# Patient Record
Sex: Male | Born: 1960 | Race: White | Hispanic: No | Marital: Married | Smoking: Never smoker
Health system: Southern US, Community
[De-identification: ages and names within clinical notes are randomized; demographics above are authoritative.]

## PROBLEM LIST (undated history)

## (undated) DIAGNOSIS — B351 Tinea unguium: Secondary | ICD-10-CM

## (undated) DIAGNOSIS — W57XXXA Bitten or stung by nonvenomous insect and other nonvenomous arthropods, initial encounter: Secondary | ICD-10-CM

## (undated) DIAGNOSIS — F419 Anxiety disorder, unspecified: Secondary | ICD-10-CM

## (undated) DIAGNOSIS — H18519 Endothelial corneal dystrophy, unspecified eye: Secondary | ICD-10-CM

## (undated) DIAGNOSIS — H1851 Endothelial corneal dystrophy: Secondary | ICD-10-CM

## (undated) DIAGNOSIS — I10 Essential (primary) hypertension: Secondary | ICD-10-CM

## (undated) HISTORY — PX: TONSILLECTOMY: SUR1361

## (undated) HISTORY — DX: Anxiety disorder, unspecified: F41.9

## (undated) HISTORY — DX: Endothelial corneal dystrophy, unspecified eye: H18.519

## (undated) HISTORY — DX: Tinea unguium: B35.1

## (undated) HISTORY — DX: Endothelial corneal dystrophy: H18.51

## (undated) HISTORY — PX: LITHOTRIPSY: SUR834

## (undated) HISTORY — DX: Bitten or stung by nonvenomous insect and other nonvenomous arthropods, initial encounter: W57.XXXA

## (undated) HISTORY — PX: NASAL SEPTUM SURGERY: SHX37

## (undated) HISTORY — DX: Essential (primary) hypertension: I10

---

## 2005-10-25 ENCOUNTER — Ambulatory Visit: Payer: Self-pay | Admitting: Family Medicine

## 2005-11-23 ENCOUNTER — Ambulatory Visit: Payer: Self-pay | Admitting: Internal Medicine

## 2007-08-29 ENCOUNTER — Telehealth: Payer: Self-pay | Admitting: Internal Medicine

## 2007-09-03 ENCOUNTER — Ambulatory Visit: Payer: Self-pay | Admitting: Internal Medicine

## 2007-09-03 DIAGNOSIS — F411 Generalized anxiety disorder: Secondary | ICD-10-CM

## 2007-09-03 DIAGNOSIS — L258 Unspecified contact dermatitis due to other agents: Secondary | ICD-10-CM

## 2007-09-25 ENCOUNTER — Encounter: Payer: Self-pay | Admitting: Internal Medicine

## 2007-10-31 ENCOUNTER — Ambulatory Visit: Payer: Self-pay | Admitting: Internal Medicine

## 2008-01-28 ENCOUNTER — Ambulatory Visit: Payer: Self-pay | Admitting: Internal Medicine

## 2008-01-28 DIAGNOSIS — A6000 Herpesviral infection of urogenital system, unspecified: Secondary | ICD-10-CM | POA: Insufficient documentation

## 2008-01-28 DIAGNOSIS — B3749 Other urogenital candidiasis: Secondary | ICD-10-CM

## 2008-04-09 ENCOUNTER — Telehealth: Payer: Self-pay | Admitting: Internal Medicine

## 2008-05-08 ENCOUNTER — Ambulatory Visit: Payer: Self-pay | Admitting: Internal Medicine

## 2008-05-08 DIAGNOSIS — K219 Gastro-esophageal reflux disease without esophagitis: Secondary | ICD-10-CM

## 2008-05-08 DIAGNOSIS — R42 Dizziness and giddiness: Secondary | ICD-10-CM | POA: Insufficient documentation

## 2008-08-10 ENCOUNTER — Ambulatory Visit: Payer: Self-pay | Admitting: Internal Medicine

## 2008-10-12 ENCOUNTER — Ambulatory Visit: Payer: Self-pay | Admitting: Internal Medicine

## 2008-12-16 ENCOUNTER — Ambulatory Visit: Payer: Self-pay | Admitting: Internal Medicine

## 2009-03-28 ENCOUNTER — Telehealth: Payer: Self-pay | Admitting: *Deleted

## 2009-04-04 ENCOUNTER — Emergency Department (HOSPITAL_BASED_OUTPATIENT_CLINIC_OR_DEPARTMENT_OTHER): Admission: EM | Admit: 2009-04-04 | Discharge: 2009-04-04 | Payer: Self-pay | Admitting: Emergency Medicine

## 2009-04-05 ENCOUNTER — Ambulatory Visit: Payer: Self-pay | Admitting: Internal Medicine

## 2009-04-05 DIAGNOSIS — IMO0001 Reserved for inherently not codable concepts without codable children: Secondary | ICD-10-CM | POA: Insufficient documentation

## 2009-04-15 ENCOUNTER — Telehealth: Payer: Self-pay | Admitting: Internal Medicine

## 2009-08-17 ENCOUNTER — Telehealth: Payer: Self-pay | Admitting: *Deleted

## 2009-09-15 ENCOUNTER — Ambulatory Visit: Payer: Self-pay | Admitting: Internal Medicine

## 2009-12-22 ENCOUNTER — Ambulatory Visit: Payer: Self-pay | Admitting: Internal Medicine

## 2009-12-22 LAB — CONVERTED CEMR LAB
ALT: 27 units/L (ref 0–53)
AST: 22 units/L (ref 0–37)
Albumin: 4.1 g/dL (ref 3.5–5.2)
Calcium: 9.4 mg/dL (ref 8.4–10.5)
Eosinophils Relative: 1.8 % (ref 0.0–5.0)
GFR calc non Af Amer: 85.1 mL/min (ref >60)
HCT: 45.8 % (ref 39.0–52.0)
Hemoglobin: 14.9 g/dL (ref 13.0–17.0)
Lymphs Abs: 1.6 10*3/uL (ref 0.7–4.0)
Monocytes Relative: 9 % (ref 3.0–12.0)
Neutro Abs: 3.9 10*3/uL (ref 1.4–7.7)
RDW: 12.4 % (ref 11.5–14.6)
Sodium: 142 meq/L (ref 135–145)
Total Protein: 6.7 g/dL (ref 6.0–8.3)
WBC: 6.2 10*3/uL (ref 4.5–10.5)

## 2010-02-07 ENCOUNTER — Ambulatory Visit: Payer: Self-pay | Admitting: Internal Medicine

## 2010-02-07 DIAGNOSIS — N200 Calculus of kidney: Secondary | ICD-10-CM | POA: Insufficient documentation

## 2010-02-07 DIAGNOSIS — F988 Other specified behavioral and emotional disorders with onset usually occurring in childhood and adolescence: Secondary | ICD-10-CM | POA: Insufficient documentation

## 2010-04-26 ENCOUNTER — Ambulatory Visit: Payer: Self-pay | Admitting: Internal Medicine

## 2010-05-27 ENCOUNTER — Ambulatory Visit: Payer: Self-pay | Admitting: Internal Medicine

## 2010-06-27 ENCOUNTER — Telehealth: Payer: Self-pay | Admitting: Internal Medicine

## 2010-07-27 ENCOUNTER — Telehealth: Payer: Self-pay | Admitting: Internal Medicine

## 2010-08-01 ENCOUNTER — Telehealth: Payer: Self-pay | Admitting: Internal Medicine

## 2010-08-10 ENCOUNTER — Ambulatory Visit: Payer: Self-pay | Admitting: Family Medicine

## 2010-08-10 DIAGNOSIS — L732 Hidradenitis suppurativa: Secondary | ICD-10-CM

## 2010-08-11 ENCOUNTER — Encounter: Payer: Self-pay | Admitting: Internal Medicine

## 2010-08-17 ENCOUNTER — Telehealth (INDEPENDENT_AMBULATORY_CARE_PROVIDER_SITE_OTHER): Payer: Self-pay | Admitting: *Deleted

## 2010-08-29 ENCOUNTER — Telehealth: Payer: Self-pay | Admitting: Internal Medicine

## 2010-09-12 ENCOUNTER — Ambulatory Visit: Payer: Self-pay | Admitting: Internal Medicine

## 2010-09-12 DIAGNOSIS — N401 Enlarged prostate with lower urinary tract symptoms: Secondary | ICD-10-CM

## 2010-09-27 ENCOUNTER — Telehealth: Payer: Self-pay | Admitting: Internal Medicine

## 2010-09-30 ENCOUNTER — Telehealth: Payer: Self-pay | Admitting: Internal Medicine

## 2010-11-03 ENCOUNTER — Telehealth: Payer: Self-pay | Admitting: Internal Medicine

## 2010-11-03 ENCOUNTER — Ambulatory Visit
Admission: RE | Admit: 2010-11-03 | Discharge: 2010-11-03 | Payer: Self-pay | Source: Home / Self Care | Attending: Internal Medicine | Admitting: Internal Medicine

## 2010-11-10 NOTE — Progress Notes (Signed)
Summary: NEW RX  Phone Note Refill Request Call back at Work Phone [phone] Call back at [phone]   Refills Requested: Medication #1:  ADDERALL 15 MG TABS One two times a day early am & 6 hours later NEW  RX  Initial call taken by: Heron Sabins,  August 29, 2010 1:35 PM    Prescriptions: ADDERALL 15 MG TABS (AMPHETAMINE-DEXTROAMPHETAMINE) One two times a day early am & 6 hours later  #60 x 0   Entered by:   Willy Eddy, LPN   Authorized by:   Stacie Glaze MD   Signed by:   Willy Eddy, LPN on 95/62/1308   Method used:   Print then Give to Patient   RxID:   [id number]

## 2010-11-10 NOTE — Progress Notes (Signed)
Summary: lab results  Phone Note Call from Patient Call back at Work Phone [phone]   Call For: Dr. Abner Greenspan Reason for Call: Lab or Test Results Action Taken: Provider Notified Summary of Call: [phone] Please call pt on his culture results. Initial call taken by: Lynann Beaver CMA AAMA,  August 17, 2010 9:21 AM  Follow-up for Phone Call        no growth, please notify Follow-up by: Danise Edge MD,  August 17, 2010 11:15 AM  Additional Follow-up for Phone Call Additional follow up Details #1::        Left message for pt to return my call. Additional Follow-up by: Josph Macho RMA,  August 17, 2010 12:03 PM    Additional Follow-up for Phone Call Additional follow up Details #2::    patient informed Follow-up by: Josph Macho RMA,  August 17, 2010 2:17 PM

## 2010-11-10 NOTE — Progress Notes (Signed)
Summary: new rx  Phone Note Refill Request Call back at Work Phone [phone] Call back at [phone] Message from:  Patient  pt would like ritalin or concerta adderall is on back order  Initial call taken by: Heron Sabins,  September 27, 2010 3:31 PM  Follow-up for Phone Call        may change to concerta added to list may print for pick up on month trial Follow-up by: Stacie Glaze MD,  September 29, 2010 8:57 AM    New/Updated Medications: CONCERTA 36 MG CR-TABS (METHYLPHENIDATE HCL) one by mouth daily Prescriptions: CONCERTA 36 MG CR-TABS (METHYLPHENIDATE HCL) one by mouth daily  #30 x 0   Entered by:   Willy Eddy, LPN   Authorized by:   Stacie Glaze MD   Signed by:   Willy Eddy, LPN on 47/82/9562   Method used:   Print then Give to Patient   RxID:   [id number]

## 2010-11-10 NOTE — Progress Notes (Signed)
Summary: [facility] called. Polysporin listed as OTC med not for eye  Phone Note From Pharmacy Call back at [phone] ext [phone]   [place]   Caller: [facility] [address]  - [place] Summary of Call: Pharmacy called and said that the Polysporin prescribed in an OTC med that can not be put in the eye. Phamacist says that there is a Bacitracin that can be put in eye.     Initial call taken by: Lucy Antigua,  November 03, 2010 10:26 AM  Follow-up for Phone Call        incorrect. there is a polysporin opthalmic version. if they don't have it then they can tell you what abx opthalmic ointments they do carry and I will change it. Follow-up by: Edwyna Perfect MD,  November 03, 2010 10:28 AM  Additional Follow-up for Phone Call Additional follow up Details #1::        pharm aware. Chris at [facility] notes that he found the correct rx and will take care of it Additional Follow-up by: Kyung Rudd, CMA,  November 03, 2010 10:36 AM

## 2010-11-10 NOTE — Progress Notes (Signed)
Summary: Pt req script for Amphetamin-Dextroamphetamine 10mg   Phone Note Refill Request Call back at Work Phone [phone] Call back at [phone] cell   (call either #) Message from:  Patient on July 27, 2010 9:35 AM  Refills Requested: Medication #1:  AMPHETAMINE-DEXTROAMPHETAMINE 10 MG TABS one by mouth  first thing in the am and 6 hours later   Dosage confirmed as above?Dosage Confirmed Pt only has 4 pills lft. Pls write script and call when ready for pick up.    Method Requested: Pick up at Office Initial call taken by: Lucy Antigua,  July 27, 2010 9:35 AM    Prescriptions: AMPHETAMINE-DEXTROAMPHETAMINE 10 MG TABS (AMPHETAMINE-DEXTROAMPHETAMINE) one by mouth  first thing in the am and 6 hours later  #60 x 0   Entered by:   Willy Eddy, LPN   Authorized by:   Stacie Glaze MD   Signed by:   Willy Eddy, LPN on 74/25/9563   Method used:   Print then Give to Patient   RxID:   [id number] AMPHETAMINE-DEXTROAMPHETAMINE 10 MG TABS (AMPHETAMINE-DEXTROAMPHETAMINE) one by mouth  first thing in the am and 6 hours later  #60 x 0   Entered by:   Willy Eddy, LPN   Authorized by:   Stacie Glaze MD   Signed by:   Willy Eddy, LPN on 60/63/0160   Method used:   Print then Give to Patient   RxID:   [id number]

## 2010-11-10 NOTE — Assessment & Plan Note (Signed)
Summary: 1 month follow up/cjr   Vital Signs:  Patient profile:   50 year old male Height:      73 inches Weight:      250 pounds BMI:     33.10 Temp:     98.2 degrees F oral Pulse rate:   72 / minute Resp:     14 per minute BP sitting:   140 / 80  (left arm)  Vitals Entered By: Willy Eddy, LPN (May 27, 2010 1:58 PM) CC: roa- states new samples meds helped. stopped for a while but started back and was helping with anxiety- name unknown, Hypertension Management   CC:  roa- states new samples meds helped. stopped for a while but started back and was helping with anxiety- name unknown and Hypertension Management.  History of Present Illness: follow up new medications no apparent side effects and med seems to be workinf we have been supplying samples due to costs of new drug  Hypertension History:      He denies headache, chest pain, palpitations, dyspnea with exertion, orthopnea, PND, peripheral edema, visual symptoms, neurologic problems, syncope, and side effects from treatment.        Positive major cardiovascular risk factors include male age 45 years old or older and hypertension.  Negative major cardiovascular risk factors include negative family history for ischemic heart disease and non-tobacco-user status.     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) 11)  Acute Prostatitis  (ICD-601.0) 12)   Anxiety  (ICD-300.00) 13)  Hypertension  (ICD-401.9)  Current Problems (verified): 1)  Depression, Major, Recurrent, 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 Once Daily 2)  Sudafed 30 Mg  Tabs (Pseudoephedrine Hcl) .... Once Daily As Needed 3)  Lotrimin Ultra 1 %  Crea (Butenafine Hcl) .... Apply To Areas Bid 4)  Lexapro 10 Mg Tabs (Escitalopram Oxalate) .... One By Mouth Daily 5)  Diazepam 2 Mg Tabs (Diazepam) .... One By Mouth Bid 6)  Amphetamine-Dextroamphetamine 10 Mg Tabs (Amphetamine-Dextroamphetamine) .... One By Mouth  First Thing in The Am and 6 Hours Later 7)  Doxycycline Hyclate 100 Mg Caps (Doxycycline Hyclate) .... One By Mouth Two Times A Day X 14 Days  Current Medications (verified): 1)  Ziac 5-6.25 Mg  Tabs (Bisoprolol-Hydrochlorothiazide) .... Once Daily 2)  Sudafed 30 Mg  Tabs (Pseudoephedrine Hcl) .... Once Daily As Needed 3)  Lotrimin Ultra 1 %  Crea (Butenafine Hcl) .... Apply To Areas Bid 4)  Viibryd 40mg  .... One By Mouth Daily 5)  Diazepam 2 Mg Tabs (Diazepam) .... One By Mouth Bid 6)  Amphetamine-Dextroamphetamine 10 Mg Tabs (Amphetamine-Dextroamphetamine) .... One By Mouth  First Thing in The Am and 6 Hours Later 7)  Doxycycline Hyclate 100 Mg Caps (Doxycycline Hyclate) .... One By Mouth Two Times A Day  Allergies (verified): No Known Drug Allergies  Past History:  Social History: Last  updated: 09/03/2007 Married Never Smoked Drug use-no Regular exercise-no  Risk Factors: Exercise: no (09/03/2007)  Risk Factors: Smoking Status: never (05/27/2010) Passive Smoke Exposure: no (05/27/2010)  Past medical, surgical, family and social histories (including risk factors) reviewed, and no changes noted (except as noted below).  Past Medical History: Reviewed history from 04/05/2009 and no changes required. Hypertension Anxiety fungal nail insect bite  Family History: Reviewed history and no changes required.  Social History: Reviewed history from 09/03/2007 and no changes required. Married Never Smoked Drug use-no Regular exercise-no  Review of Systems  The patient denies anorexia, fever, weight loss, weight gain, vision loss, decreased hearing, hoarseness, chest pain, syncope, dyspnea on exertion, peripheral edema, prolonged cough, headaches, hemoptysis, abdominal pain, melena, hematochezia, severe indigestion/heartburn, hematuria, incontinence, genital sores, muscle weakness, suspicious skin lesions, transient blindness, difficulty walking, depression, unusual weight change, abnormal bleeding, enlarged lymph nodes, angioedema, and breast masses.    Physical Exam  General:  Well-developed,well-nourished,in no acute distress; alert,appropriate and cooperative throughout examination Eyes:  pupils equal and pupils round.  conjunctival injection and excessive tearing.   Ears:  R ear normal and L ear normal.   Neck:  No deformities, masses, or tenderness noted. Lungs:  normal respiratory effort and no wheezes.   Abdomen:  soft and non-tender.   Msk:  normal ROM and no joint tenderness.   Neurologic:  gait normal.     Impression & Recommendations:  Problem # 1:  DEPRESSION, MAJOR, RECURRENT, SEVERE (ICD-296.33) viibryd trial with  samples for medication resistant feels he has an increased sense of well being  Problem # 2:  ATTENTION DEFICIT DISORDER, ADULT  (ICD-314.00) on generic ritalin  Problem # 3:  ACUTE PROSTATITIS (ICD-601.0) increased urination and frequency  Problem # 4:  HYPERTENSION (ICD-401.9)  His updated medication list for this problem includes:    Ziac 5-6.25 Mg Tabs (Bisoprolol-hydrochlorothiazide) ..... Once daily  BP today: 140/80 Prior BP: 140/80 (04/26/2010)  Prior 10 Yr Risk Heart Disease: Not enough information (09/03/2007)  Labs Reviewed: K+: 3.6 (12/22/2009) Creat: : 1.0 (12/22/2009)     Complete Medication List: 1)  Ziac 5-6.25 Mg Tabs (Bisoprolol-hydrochlorothiazide) .... Once daily 2)  Sudafed 30 Mg Tabs (Pseudoephedrine hcl) .... Once daily as needed 3)  Lotrimin Ultra 1 % Crea (Butenafine hcl) .... Apply to areas bid 4)  Viibryd 40mg   .... One by mouth daily 5)  Diazepam 2 Mg Tabs (Diazepam) .... One by mouth bid 6)  Amphetamine-dextroamphetamine 10 Mg Tabs (Amphetamine-dextroamphetamine) .... One by mouth  first thing in the am and 6 hours later 7)  Doxycycline Hyclate 100 Mg Caps (Doxycycline hyclate) .... One by mouth two times a day  Hypertension Assessment/Plan:      The patient's hypertensive risk group is category B: At least one risk factor (excluding diabetes) with no target organ damage.  Today's blood pressure is 140/80.  His blood pressure goal is < 140/90.  Patient Instructions: 1)  Please schedule a follow-up appointment in 3 months. Prescriptions: AMPHETAMINE-DEXTROAMPHETAMINE 10 MG TABS (AMPHETAMINE-DEXTROAMPHETAMINE)  one by mouth  first thing in the am and 6 hours later  #60 x 0   Entered and Authorized by:   Stacie Glaze MD   Signed by:   Stacie Glaze MD on 05/27/2010   Method used:   Print then Give to Patient   RxID:   [id number] AMPHETAMINE-DEXTROAMPHETAMINE 10 MG TABS (AMPHETAMINE-DEXTROAMPHETAMINE) one by mouth  first thing in the am and 6 hours later  #60 x 0   Entered and Authorized by:   Stacie Glaze MD   Signed by:   Stacie Glaze MD on 05/27/2010   Method  used:   Print then Give to Patient   RxID:   [id number] AMPHETAMINE-DEXTROAMPHETAMINE 10 MG TABS (AMPHETAMINE-DEXTROAMPHETAMINE) one by mouth  first thing in the am and 6 hours later  #60 x 0   Entered and Authorized by:   Stacie Glaze MD   Signed by:   Stacie Glaze MD on 05/27/2010   Method used:   Print then Give to Patient   RxID:   [id number] DOXYCYCLINE HYCLATE 100 MG CAPS (DOXYCYCLINE HYCLATE) one by mouth two times a day  #30 x 0   Entered and Authorized by:   Stacie Glaze MD   Signed by:   Stacie Glaze MD on 05/27/2010   Method used:   Electronically to        [facility] [address] (retail)       [address]       [place], [place]  [address]       Ph: [phone]       Fax: [phone]   RxID:   [id number]

## 2010-11-10 NOTE — Progress Notes (Signed)
  Phone Note Call from Patient   Summary of Call: had adderall changed to concerta due to adderall not being available- now wants to change concerta to ritalin due to cost of concerta Initial call taken by: Willy Eddy, LPN,  September 30, 2010 1:27 PM  Follow-up for Phone Call        dr Kirtland Bouchard wrote for 3 months Follow-up by: Willy Eddy, LPN,  September 30, 2010 2:46 PM    New/Updated Medications: METHYLPHENIDATE HCL CR 20 MG CR-TABS (METHYLPHENIDATE HCL) one every am Prescriptions: METHYLPHENIDATE HCL CR 20 MG CR-TABS (METHYLPHENIDATE HCL) one every am  #30 x 0   Entered and Authorized by:   Gordy Savers  MD   Signed by:   Gordy Savers  MD on 09/30/2010   Method used:   Print then Give to Patient   RxID:   [id number]

## 2010-11-10 NOTE — Assessment & Plan Note (Signed)
Summary: 1 mo rov/mm/pt rsc/cjr   Vital Signs:  Patient profile:   50 year old male Height:      73 inches Weight:      246 pounds BMI:     32.57 Temp:     98.2 degrees F oral Pulse rate:   72 / minute Resp:     14 per minute BP sitting:   140 / 80  (left arm)  Vitals Entered By: Willy Eddy, LPN (April 26, 2010 3:51 PM) CC: roa- requesting a change in amphetamine-talked with pharmacist- amphetamine caps 10 bid are 60.00-- the XR caps are 210.00dollars, Hypertension Management   CC:  roa- requesting a change in amphetamine-talked with pharmacist- amphetamine caps 10 bid are 60.00-- the XR caps are 210.00dollars and Hypertension Management.  History of Present Illness: the pt has been on rritalin for add and depression and responded but it was too expensive the pt has been told that the generic shortacting is cheaper and want to try this has been having a hard tme with concentration and work and has been very distracted   Hypertension History:      He denies headache, chest pain, palpitations, dyspnea with exertion, orthopnea, PND, peripheral edema, visual symptoms, neurologic problems, syncope, and side effects from treatment.        Positive major cardiovascular risk factors include male age 36 years old or older and hypertension.  Negative major cardiovascular risk factors include negative family history for ischemic heart disease and non-tobacco-user status.     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 Once Daily 2)  Sudafed 30 Mg  Tabs (Pseudoephedrine Hcl) .... Once Daily As Needed 3)  Lotrimin Ultra 1 %  Crea (Butenafine Hcl) .... Apply To Areas Bid 4)  Lexapro 10 Mg Tabs (Escitalopram Oxalate) .... One By Mouth Daily 5)  Diazepam 2 Mg Tabs (Diazepam) .... One By Mouth Bid 6)  Amphetamine-Dextroamphetamine 10 Mg Xr24h-Cap (Amphetamine-Dextroamphetamine) .... One By Mouth Daily ( Will Need Prior Authorization) 7)  Doxycycline Hyclate 100 Mg Caps (Doxycycline Hyclate) .... One By Mouth Two Times A Day X 14 Days  Current Medications (verified): 1)  Ziac 5-6.25 Mg  Tabs (Bisoprolol-Hydrochlorothiazide) .... Once Daily 2)  Sudafed 30 Mg  Tabs (Pseudoephedrine Hcl) .... Once Daily As Needed 3)  Lotrimin Ultra 1 %  Crea (Butenafine Hcl) .... Apply To Areas Bid 4)  Lexapro 10 Mg Tabs (Escitalopram Oxalate) .... One By Mouth Daily 5)  Diazepam 2 Mg Tabs (Diazepam) .... One By Mouth Bid 6)  Amphetamine-Dextroamphetamine 10 Mg Xr24h-Cap (Amphetamine-Dextroamphetamine) .... One By Mouth Daily ( Will Need Prior Authorization) 7)  Doxycycline Hyclate 100 Mg Caps (Doxycycline Hyclate) .... One By Mouth Two Times A Day X 14 Days  Allergies (verified): No Known Drug Allergies  Past History:  Social History: Last  updated: 09/03/2007 Married Never Smoked Drug use-no Regular exercise-no  Risk Factors: Exercise: no (09/03/2007)  Risk Factors: Smoking Status: never (04/26/2010) Passive Smoke Exposure: no (04/26/2010)  Past medical, surgical, family and social histories (including risk factors) reviewed, and no changes noted (except as noted below).  Past Medical History: Reviewed history from 04/05/2009 and no changes  required. Hypertension Anxiety fungal nail insect bite  Family History: Reviewed history and no changes required.  Social History: Reviewed history from 09/03/2007 and no changes required. Married Never Smoked Drug use-no Regular exercise-no  Review of Systems  The patient denies anorexia, fever, weight loss, weight gain, vision loss, decreased hearing, hoarseness, chest pain, syncope, dyspnea on exertion, peripheral edema, prolonged cough, headaches, hemoptysis, abdominal pain, melena, hematochezia, severe indigestion/heartburn, hematuria, incontinence, genital sores, muscle weakness, suspicious skin lesions, transient blindness, difficulty walking, depression, unusual weight change, abnormal bleeding, enlarged lymph nodes, angioedema, and breast masses.    Physical Exam  General:  Well-developed,well-nourished,in no acute distress; alert,appropriate and cooperative throughout examination Head:  normocephalic and male-pattern balding.   Eyes:  pupils equal and pupils round.  conjunctival injection and excessive tearing.   Ears:  R ear normal and L ear normal.   Nose:  no external deformity and no nasal discharge.   Mouth:  Oral mucosa and oropharynx without lesions or exudates.  Teeth in good repair. Neck:  No deformities, masses, or tenderness noted. Lungs:  normal respiratory effort and no wheezes.   Heart:  normal rate and regular rhythm.   Msk:  normal ROM and no joint tenderness.   Neurologic:  alert & oriented X3 and cranial nerves II-XII intact.     Impression & Recommendations:  Problem # 1:  OTHER MALAISE AND FATIGUE (ICD-780.79) due to depression  Problem # 2:  HYPERTENSION (ICD-401.9) stable His updated medication list for this problem includes:    Ziac 5-6.25 Mg Tabs (Bisoprolol-hydrochlorothiazide) ..... Once daily  BP today: 140/80 Prior BP: 144/90 (02/07/2010)  Prior 10 Yr Risk Heart Disease: Not enough information (09/03/2007)  Labs Reviewed: K+:  3.6 (12/22/2009) Creat: : 1.0 (12/22/2009)     Problem # 3:  ATTENTION DEFICIT DISORDER, ADULT (ICD-314.00) ritalin generic 10 mg two times a day  Problem # 4:  DEPRESSION, MAJOR, RECURRENT, SEVERE (ICD-296.33) vegitative symptoms  with failure of lexapro? consider psych referral trial of vibriid  Complete Medication List: 1)  Ziac 5-6.25 Mg Tabs (Bisoprolol-hydrochlorothiazide) .... Once daily 2)  Sudafed 30 Mg Tabs (Pseudoephedrine hcl) .... Once daily as needed 3)  Lotrimin Ultra 1 % Crea (Butenafine hcl) .... Apply to areas bid 4)  Lexapro 10 Mg Tabs (Escitalopram oxalate) .... One by mouth daily 5)  Diazepam 2 Mg Tabs (Diazepam) .... One by mouth bid 6)  Amphetamine-dextroamphetamine 10 Mg Tabs (Amphetamine-dextroamphetamine) .... One by mouth  first thing in the am and 6 hours later 7)  Doxycycline Hyclate 100 Mg Caps (Doxycycline hyclate) .... One by mouth two times a day x 14 days  Hypertension Assessment/Plan:      The patient's hypertensive risk group is category B: At least one risk factor (excluding diabetes) with no target organ damage.  Today's blood pressure is 140/80.  His blood pressure goal is < 140/90.  Patient Instructions: 1)  Please schedule a follow-up appointment in 2 months. Prescriptions: AMPHETAMINE-DEXTROAMPHETAMINE 10 MG TABS (AMPHETAMINE-DEXTROAMPHETAMINE) one by mouth  first thing in the am and 6 hours later  #60 x 0   Entered and Authorized by:   Stacie Glaze MD  Signed by:   Stacie Glaze MD on 04/26/2010   Method used:   Print then Give to Patient   RxID:   [id number]

## 2010-11-10 NOTE — Progress Notes (Signed)
Summary: new rx  Phone Note Call from Patient Call back at Work Phone [phone] Call back at [phone]   Caller: Patient Call For: Stacie Glaze MD Summary of Call: pt needs new rx generic adderall 10 mg call when ready for pick up Initial call taken by: Heron Sabins,  June 27, 2010 10:14 AM    Prescriptions: AMPHETAMINE-DEXTROAMPHETAMINE 10 MG TABS (AMPHETAMINE-DEXTROAMPHETAMINE) one by mouth  first thing in the am and 6 hours later  #60 x 0   Entered by:   Willy Eddy, LPN   Authorized by:   Stacie Glaze MD   Signed by:   Willy Eddy, LPN on 84/69/6295   Method used:   Print then Give to Patient   RxID:   [id number]

## 2010-11-10 NOTE — Assessment & Plan Note (Signed)
Summary: 3 month rov/njr/pt rsc/cjr   Vital Signs:  Patient profile:   50 year old male Height:      73 inches Weight:      244 pounds BMI:     32.31 Temp:     98.2 degrees F oral Pulse rate:   76 / minute Resp:     14 per minute BP sitting:   140 / 84  (left arm)  Vitals Entered By: Willy Eddy, LPN (September 12, 2010 2:51 PM) CC: roa- changed adderall to 15mg  due to availability and has difficulty finding 15, also states he is having side effects from 15 mg, Hypertension Management Is Patient Diabetic? No   Primary Care Provider:  Stacie Glaze MD  CC:  roa- changed adderall to 15mg  due to availability and has difficulty finding 15, also states he is having side effects from 15 mg, and Hypertension Management.  History of Present Illness: The site of the bite or cellulitis was normal The  pt has mild axiety Increased urgency and frequency due to BPH the pt has been on ADHD drugs Does not have any dysuria   Hypertension History:      He denies headache, chest pain, palpitations, dyspnea with exertion, orthopnea, PND, peripheral edema, visual symptoms, neurologic problems, syncope, and side effects from treatment.        Positive major cardiovascular risk factors include male age 55 years old or older and hypertension.  Negative major cardiovascular risk factors include negative family history for ischemic heart disease and non-tobacco-user status.     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) 5)  Other Malaise and Fatigue  (ICD-780.79) 6)  Insect Bite, Infected  (ICD-919.5) 7)  Dizziness, Chronic  (ICD-780.4) 8)  Gastroesophageal Reflux Disease, Chronic   (ICD-530.81) 9)  Yeast Balanitis  (ICD-112.2) 10)  Genital Herpes  (ICD-054.10) 11)  Contact Dermatitis&oth Eczema Due Oth Spec Agent  (ICD-692.89) 12)  Benign Prostatic Hypertrophy, With Obstruction  (ICD-600.01) 13)  Anxiety  (ICD-300.00) 14)  Hypertension  (ICD-401.9)  Current Problems (verified): 1)  Hidradenitis Suppurativa  (ICD-705.83) 2)  Depression, Major, Recurrent, Severe  (ICD-296.33) 3)  Attention Deficit Disorder, Adult  (ICD-314.00) 4)  Renal Calculus, 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 Once Daily 2)  Sudafed 30 Mg  Tabs (Pseudoephedrine Hcl) .... Once Daily As Needed 3)  Viibryd 40mg  .... One By Mouth Daily 4)  Diazepam 2 Mg Tabs (Diazepam) .... One By Mouth Bid 5)  Amphetamine-Dextroamphetamine 10 Mg Tabs (Amphetamine-Dextroamphetamine) .... One By Mouth  First Thing in The Am and 6 Hours Later 6)  Adderall 15 Mg Tabs (Amphetamine-Dextroamphetamine) .... One Two Times A Day Early Am & 6 Hours Later 7)  Bactrim Ds 800-160 Mg Tabs (Sulfamethoxazole-Trimethoprim) .Marland KitchenMarland KitchenMarland Kitchen 1  Tab By Mouth Two Times A Day X 10 Days 8)  Bacitracin 500 Unit/gm Oint (Bacitracin) .... Apply Small Amount To B/l Nares At Bedtime X 7days 9)  Align 4 Mg Caps (Probiotic Product) .Marland Kitchen.. 1 Cap By Mouth Daily As Needed Antibiotic Use  Current Medications (verified): 1)  Ziac 5-6.25 Mg  Tabs (Bisoprolol-Hydrochlorothiazide) .... Once Daily 2)  Sudafed 30 Mg  Tabs (Pseudoephedrine Hcl) .... Once Daily As Needed 3)  Viibryd 40  Mg Tabs (Vilazodone Hcl) .... One By Mouth Daily 4)  Diazepam 2 Mg Tabs (Diazepam) .... One By Mouth Bid 5)  Adderall 15 Mg Tabs (Amphetamine-Dextroamphetamine) .... One Two Times A Day Early Am & 6 Hours Later 6)  Align 4 Mg Caps (Probiotic Product) .Marland Kitchen.. 1 Cap By Mouth Daily As Needed Antibiotic Use 7)  Saw Palmetto Extract 160-15 Mg Caps (Saw Palmetto-Zinc) .... One By Mouth Two Times A Day  Allergies (verified): No Known Drug Allergies  Past History:  Social History: Last updated: 09/03/2007 Married Never Smoked Drug use-no Regular exercise-no  Risk Factors: Exercise: no (09/03/2007)  Risk Factors: Smoking Status: never (09/12/2010) Passive Smoke Exposure: no (09/12/2010)  Past medical, surgical, family and social histories (including risk factors) reviewed, and no changes noted (except as noted below).  Past Medical History: Reviewed history from 04/05/2009 and no changes required. Hypertension Anxiety fungal nail insect bite  Family History: Reviewed history and no changes required.  Social History: Reviewed history from 09/03/2007 and no changes required. Married Never Smoked Drug use-no Regular exercise-no  Review of Systems  The patient denies anorexia, fever, weight loss, weight gain, vision loss, decreased hearing, hoarseness, chest pain, syncope, dyspnea on exertion, peripheral edema, prolonged cough, headaches, hemoptysis, abdominal pain, melena, hematochezia, severe indigestion/heartburn, hematuria, incontinence, genital sores, muscle weakness, suspicious skin lesions, transient blindness, difficulty walking, depression, unusual weight change, abnormal bleeding, enlarged lymph nodes, angioedema, breast masses, and testicular masses.    Physical Exam  General:  Well-developed,well-nourished,in no acute distress; alert,appropriate and cooperative throughout examination Head:  Normocephalic and atraumatic without obvious abnormalities. No apparent alopecia  or balding. Eyes:  pupils equal and pupils round.   Ears:  R ear normal and L ear normal.   Nose:  no external deformity and no nasal discharge.   Neck:  No deformities, masses, or tenderness noted. Lungs:  Normal respiratory effort, chest expands symmetrically. Lungs are clear to auscultation, no crackles or wheezes. Heart:  Normal rate and regular rhythm. S1 and S2 normal without gallop, murmur, click, rub or other extra sounds. Abdomen:  Bowel sounds positive,abdomen soft and non-tender without masses, organomegaly or hernias noted.   Impression & Recommendations:  Problem # 1:  BENIGN PROSTATIC HYPERTROPHY, WITH OBSTRUCTION (ICD-600.01) saw palmeto with zinc  Problem # 2:  DEPRESSION, MAJOR, RECURRENT, SEVERE (ICD-296.33) stable  Problem # 3:  ATTENTION DEFICIT DISORDER, ADULT (ICD-314.00) Assessment: Unchanged on adderal  Problem # 4:  HYPERTENSION (ICD-401.9) Assessment: Unchanged  His updated medication list for this problem includes:    Ziac 5-6.25 Mg Tabs (Bisoprolol-hydrochlorothiazide) ..... Once daily  BP today: 140/84 Prior BP: 142/88 (08/10/2010)  Prior 10 Yr Risk Heart Disease: Not enough information (09/03/2007)  Labs Reviewed: K+: 3.6 (12/22/2009) Creat: : 1.0 (12/22/2009)     Complete Medication List: 1)  Ziac 5-6.25 Mg Tabs (Bisoprolol-hydrochlorothiazide) .... Once daily 2)  Sudafed 30 Mg Tabs (Pseudoephedrine hcl) .... Once daily as needed 3)  Viibryd 40 Mg Tabs (Vilazodone hcl) .... One by mouth daily 4)  Diazepam 2 Mg Tabs (  Diazepam) .... One by mouth bid 5)  Adderall 15 Mg Tabs (Amphetamine-dextroamphetamine) .... One two times a day early am & 6 hours later 6)  Align 4 Mg Caps (Probiotic product) .Marland Kitchen.. 1 cap by mouth daily as needed antibiotic use 7)  Saw Palmetto Extract 160-15 Mg Caps (Saw palmetto-zinc) .... One by mouth two times a day  Hypertension Assessment/Plan:      The patient's hypertensive risk group is category B: At least one risk  factor (excluding diabetes) with no target organ damage.  Today's blood pressure is 140/84.  His blood pressure goal is < 140/90.  Patient Instructions: 1)  Please schedule a follow-up appointment in 3 months. Prescriptions: ADDERALL 15 MG TABS (AMPHETAMINE-DEXTROAMPHETAMINE) One two times a day early am & 6 hours later  #60 x 0   Entered and Authorized by:   Stacie Glaze MD   Signed by:   Stacie Glaze MD on 09/12/2010   Method used:   Print then Give to Patient   RxID:   [id number] ADDERALL 15 MG TABS (AMPHETAMINE-DEXTROAMPHETAMINE) One two times a day early am & 6 hours later  #60 x 0   Entered and Authorized by:   Stacie Glaze MD   Signed by:   Stacie Glaze MD on 09/12/2010   Method used:   Print then Give to Patient   RxID:   [id number] ADDERALL 15 MG TABS (AMPHETAMINE-DEXTROAMPHETAMINE) One two times a day early am & 6 hours later  #60 x 0   Entered and Authorized by:   Stacie Glaze MD   Signed by:   Stacie Glaze MD on 09/12/2010   Method used:   Print then Give to Patient   RxID:   [id number]    Orders Added: 1)  Est. Patient Level IV [59563]

## 2010-11-10 NOTE — Assessment & Plan Note (Signed)
Summary: SPIDER BITE // RS   Vital Signs:  Patient profile:   50 year old male Height:      73 inches (185.42 cm) Weight:      244 pounds (110.91 kg) O2 Sat:      97 % on Room air Temp:     98.4 degrees F (36.89 degrees C) oral Pulse rate:   78 / minute BP sitting:   142 / 88  (left arm) Cuff size:   large  Vitals Entered By: Josph Macho RMA (August 10, 2010 11:52 AM)  O2 Flow:  Room air CC: Possible spider bite on left buttocks x 2 days-chills and more tired than usual last night/ CF Is Patient Diabetic? No   History of Present Illness: patient is a 50 year old Caucasian male in today with 2 day history of worsening infection on his left lower health. He believes it is a spider bite although he never saw a spider the lesion is slowly enlarging and now achy and no discharge from the lesion he's had no fevers,  myalgias, malaise. Has had some mild nausea, mild chills last night and a slight headache today denies vomiting, diarrhea, anorexia. Has had two similar lesions in the past he thought were spider spites  Current Medications (verified): 1)  Ziac 5-6.25 Mg  Tabs (Bisoprolol-Hydrochlorothiazide) .... Once Daily 2)  Sudafed 30 Mg  Tabs (Pseudoephedrine Hcl) .... Once Daily As Needed 3)  Viibryd 40mg  .... One By Mouth Daily 4)  Diazepam 2 Mg Tabs (Diazepam) .... One By Mouth Bid 5)  Amphetamine-Dextroamphetamine 10 Mg Tabs (Amphetamine-Dextroamphetamine) .... One By Mouth  First Thing in The Am and 6 Hours Later 6)  Adderall 15 Mg Tabs (Amphetamine-Dextroamphetamine) .... One Two Times A Day Early Am & 6 Hours Later  Allergies (verified): No Known Drug Allergies  Past History:  Past medical history reviewed for relevance to current acute and chronic problems. Social history (including risk factors) reviewed for relevance to current acute and chronic problems.  Past Medical History: Reviewed history from 04/05/2009 and no changes required. Hypertension Anxiety fungal  nail insect bite  Social History: Reviewed history from 09/03/2007 and no changes required. Married Never Smoked Drug use-no Regular exercise-no  Review of Systems      See HPI       Flu Vaccine Consent Questions     Do you have a history of severe allergic reactions to this vaccine? no    Any prior history of allergic reactions to egg and/or gelatin? no    Do you have a sensitivity to the preservative Thimersol? no    Do you have a past history of Guillan-Barre Syndrome? no    Do you currently have an acute febrile illness? no    Have you ever had a severe reaction to latex? no    Vaccine information given and explained to patient? yes    Are you currently pregnant? no    Lot Number:[id number]   Exp Date:04/08/2011   Site Given  Left Deltoid IM Pura Spice, RN  August 10, 2010 1:00 PM   Physical Exam  General:  Well-developed,well-nourished,in no acute distress; alert,appropriate and cooperative throughout examination Head:  Normocephalic and atraumatic without obvious abnormalities. No apparent alopecia or balding. Mouth:  Oral mucosa and oropharynx without lesions or exudates.  Teeth in good repair. Neck:  No deformities, masses, or tenderness noted. Lungs:  Normal respiratory effort, chest expands symmetrically. Lungs are clear to auscultation, no crackles or wheezes. Heart:  Normal rate and regular rhythm. S1 and S2 normal without gallop, murmur, click, rub or other extra sounds. Abdomen:  Bowel sounds positive,abdomen soft and non-tender without masses, organomegaly or hernias noted. Extremities:  No clubbing, cyanosis, edema, or deformity noted .   Skin:  3 x 2 cm fluctuant lesion at creese between left buttock and upper thigh, central lesion has a small pustule at the head.  Psych:  Cognition and judgment appear intact. Alert and cooperative with normal attention span and concentration. No apparent delusions, illusions, hallucinations   Impression &  Recommendations:  Problem # 1:  HIDRADENITIS SUPPURATIVA (ICD-705.83) Started on Bactrim DS and asked to take a sitz bath with 1 tbls of bleach in it each time two times a day for next 3 days , then clean with peroxide, keep covered if getting dressed, place Bacitracin  in nares in b/l at bedtime x 7days. Lesion cultured  Problem # 2:  DEPRESSION, MAJOR, RECURRENT, SEVERE (ICD-296.33) Feels the VIbryd has been helpful, will continue  Problem # 3:  HYPERTENSION (ICD-401.9)  His updated medication list for this problem includes:    Ziac 5-6.25 Mg Tabs (Bisoprolol-hydrochlorothiazide) ..... Once daily No changes to meds  Complete Medication List: 1)  Ziac 5-6.25 Mg Tabs (Bisoprolol-hydrochlorothiazide) .... Once daily 2)  Sudafed 30 Mg Tabs (Pseudoephedrine hcl) .... Once daily as needed 3)  Viibryd 40mg   .... One by mouth daily 4)  Diazepam 2 Mg Tabs (Diazepam) .... One by mouth bid 5)  Amphetamine-dextroamphetamine 10 Mg Tabs (Amphetamine-dextroamphetamine) .... One by mouth  first thing in the am and 6 hours later 6)  Adderall 15 Mg Tabs (Amphetamine-dextroamphetamine) .... One two times a day early am & 6 hours later 7)  Bactrim Ds 800-160 Mg Tabs (Sulfamethoxazole-trimethoprim) .Marland Kitchen.. 1 tab by mouth two times a day x 10 days 8)  Bacitracin 500 Unit/gm Oint (Bacitracin) .... Apply small amount to b/l nares at bedtime x 7days 9)  Align 4 Mg Caps (Probiotic product) .Marland Kitchen.. 1 cap by mouth daily as needed antibiotic use  Other Orders: T-Culture, Wound (87070/[id number]) Admin 1st Vaccine (04540) Flu Vaccine 2yrs + (98119)  Patient Instructions: 1)  Please schedule a follow-up appointment as needed if lesion does not resolve.  2)  Take a hot Sitz bath for 10 to 15 minutes two times a day for the next 3 days with 1 tbls of bleach in it 3)  Apply the bacitracin as directed and place a bandaid with or without the Bacitracin on it whenever getting Prescriptions: BACTRIM DS 800-160 MG TABS  (SULFAMETHOXAZOLE-TRIMETHOPRIM) 1 tab by mouth two times a day x 10 days  #20 x 0   Entered and Authorized by:   Danise Edge MD   Signed by:   Danise Edge MD on 08/10/2010   Method used:   Electronically to        [facility] # [phone]* (retail)       [address]       [place], [place]  [address]       Ph: [phone]       Fax: [phone]   RxID:   [id number]    Orders Added: 1)  T-Culture, Wound [87070/[id number]] 2)  Admin 1st Vaccine [90471] 3)  Flu Vaccine 6yrs + [34742] 4)  Est. Patient Level IV [59563]

## 2010-11-10 NOTE — Assessment & Plan Note (Signed)
Summary: eye issues//ccm   Vital Signs:  Patient profile:   50 year old male Weight:      249 pounds Pulse rate:   76 / minute BP sitting:   126 / 92  (left arm) Cuff size:   large  Vitals Entered By: Kyung Rudd, CMA (November 03, 2010 9:25 AM) CC: pt c/o painful, swollen, blurry right eye x 2 days...has happened before in same eye also notes dry, bleeding scalp   Primary Care Provider:  Stacie Glaze MD  CC:  pt c/o painful, swollen, blurry right eye x 2 days...has happened before in same eye also notes dry, and bleeding scalp.  History of Present Illness:  patient presents to clinic as a work in for evaluation of swollen eye.  Notes one-day history of right upper eyelid swelling without injury or trauma. there is mild associated redness but no discharge or drainage. notes slight blurriness of vision. Has had no similar episodes in the past. also states his wife noticed small area of the scalp of bleeding. No injury or trauma. has no known skin lesions on the scalp. No current active bleeding.  Current Medications (verified): 1)  Ziac 5-6.25 Mg  Tabs (Bisoprolol-Hydrochlorothiazide) .... Once Daily 2)  Sudafed 30 Mg  Tabs (Pseudoephedrine Hcl) .... Once Daily As Needed 3)  Viibryd 40 Mg Tabs (Vilazodone Hcl) .... One By Mouth Daily 4)  Diazepam 2 Mg Tabs (Diazepam) .... One By Mouth Bid 5)  Saw Palmetto Extract 160-15 Mg Caps (Saw Palmetto-Zinc) .... One By Mouth Two Times A Day 6)  Methylphenidate Hcl Cr 20 Mg Cr-Tabs (Methylphenidate Hcl) .... One Every Am  Allergies (verified): No Known Drug Allergies  Past History:  Past medical, surgical, family and social histories (including risk factors) reviewed for relevance to current acute and chronic problems.  Past Medical History: Reviewed history from 04/05/2009 and no changes required. Hypertension Anxiety fungal nail insect bite  Family History: Reviewed history and no changes required.  Social History: Reviewed  history from 09/03/2007 and no changes required. Married Never Smoked Drug use-no Regular exercise-no  Review of Systems      See HPI  Physical Exam  General:  Well-developed,well-nourished,in no acute distress; alert,appropriate and cooperative throughout examination Head:  Normocephalic and atraumatic without obvious abnormalities. No apparent alopecia or balding. Eyes:  vision grossly intact, pupils equal, pupils round, pupils reactive to light, pupils react to accomodation, corneas and lenses clear, and no injection.   right upper lid diffusely swollen. No drainage. Slight redness. Ears:  no external deformities.   Nose:  no external deformity.   Skin:  turgor normal and no rashes.   summation of Crown of scalp demonstrates small 3 mm sore. No erythema discharge or drainage. No underlying skin lesion seen.   Impression & Recommendations:  Problem # 1:  BLEPHARITIS, RIGHT (ICD-373.00) Assessment New  attempt compresses to affected area.  apply  topical ophthalmic antibiotic to affected area 3 times a day x7 days. Follow closely if no improvement or worsening.  Problem # 2:  LESION, SCALP (ICD-238.2) Assessment: New  only sore visible currently. Applied topical Neosporin or several days. family member to assist in observation and notify clinic if skin growth/ lesion develops.  Complete Medication List: 1)  Ziac 5-6.25 Mg Tabs (Bisoprolol-hydrochlorothiazide) .... Once daily 2)  Sudafed 30 Mg Tabs (Pseudoephedrine hcl) .... Once daily as needed 3)  Viibryd 40 Mg Tabs (Vilazodone hcl) .... One by mouth daily 4)  Diazepam 2 Mg Tabs (Diazepam) .Marland KitchenMarland KitchenMarland Kitchen  One by mouth bid 5)  Saw Palmetto Extract 160-15 Mg Caps (Saw palmetto-zinc) .... One by mouth two times a day 6)  Methylphenidate Hcl Cr 20 Mg Cr-tabs (Methylphenidate hcl) .... One every am 7)  Polysporin 500-10000 Unit/gm Oint (Bacitracin-polymyxin b) .... Apply 1/2 inch ribbon along upper eyelid two times a day x7  days Prescriptions: POLYSPORIN 500-10000 UNIT/GM OINT (BACITRACIN-POLYMYXIN B) apply 1/2 inch ribbon along upper eyelid two times a day x7 days  #3.5gm x 0   Entered and Authorized by:   Edwyna Perfect MD   Signed by:   Edwyna Perfect MD on 11/03/2010   Method used:   Electronically to        [facility] # [id number]* (retail)       [address]       [place], [place]  [address]       Ph: [phone]       Fax: [phone]   RxID:   [id number]    Orders Added: 1)  Est. Patient Level III [66440]

## 2010-11-10 NOTE — Assessment & Plan Note (Signed)
Summary: 1 month rov/njr   Vital Signs:  Patient profile:   50 year old male Height:      73 inches Weight:      248 pounds BMI:     32.84 Temp:     98.2 degrees F oral Pulse rate:   72 / minute Resp:     14 per minute BP sitting:   144 / 90  (left arm)  Vitals Entered By: Willy Eddy, LPN (Feb 08, 3663 1:59 PM) CC: roa- pt states he couldnt afford generic ritalin--pt states he has passed kidney stone since last ov, Hypertension Management   CC:  roa- pt states he couldnt afford generic ritalin--pt states he has passed kidney stone since last ov and Hypertension Management.  History of Present Illness: burning in eyes and eye strain worried about this "blurring of vision" not using eye drops late afternoon eye fatigue did not get the retalin  due to coste slight increase in frequnecy and buring in urine had a renal stone prior to this   Hypertension History:      He denies headache, chest pain, palpitations, dyspnea with exertion, orthopnea, PND, peripheral edema, visual symptoms, neurologic problems, syncope, and side effects from treatment.        Positive major cardiovascular risk factors include male age 109 years old or older and hypertension.  Negative major cardiovascular risk factors include negative family history for ischemic heart disease and non-tobacco-user status.     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  (ICD-692.89) 8)  Acute Prostatitis  (ICD-601.0) 9)  Anxiety  (ICD-300.00) 10)  Hypertension  (ICD-401.9)  Current Problems (verified): 1)   Other Malaise and Fatigue  (ICD-780.79) 2)  Insect Bite, Infected  (ICD-919.5) 3)  Dizziness, Chronic  (ICD-780.4) 4)  Gastroesophageal Reflux Disease, Chronic  (ICD-530.81) 5)  Yeast Balanitis  (ICD-112.2) 6)  Genital Herpes  (ICD-054.10) 7)  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 Once Daily 2)  Sudafed 30 Mg  Tabs (Pseudoephedrine Hcl) .... Once Daily As Needed 3)  Lotrimin Ultra 1 %  Crea (Butenafine Hcl) .... Apply To Areas Bid 4)  Lexapro 10 Mg Tabs (Escitalopram Oxalate) .... One By Mouth Daily 5)  Diazepam 2 Mg Tabs (Diazepam) .... One By Mouth Bid 6)  Amphetamine-Dextroamphetamine 10 Mg Xr24h-Cap (Amphetamine-Dextroamphetamine) .... One By Mouth Daily  Current Medications (verified): 1)  Ziac 5-6.25 Mg  Tabs (Bisoprolol-Hydrochlorothiazide) .... Once Daily 2)  Sudafed 30 Mg  Tabs (Pseudoephedrine Hcl) .... Once Daily As Needed 3)  Lotrimin Ultra 1 %  Crea (Butenafine Hcl) .... Apply To Areas Bid 4)  Lexapro 10 Mg Tabs (Escitalopram Oxalate) .... One By Mouth Daily 5)  Diazepam 2 Mg Tabs (Diazepam) .... One By Mouth Bid 6)  Amphetamine-Dextroamphetamine 10 Mg Xr24h-Cap (Amphetamine-Dextroamphetamine) .... One By Mouth Daily ( Will Need Prior Authorization)  Allergies (verified): No Known Drug Allergies  Past History:  Social History: Last updated: 09/03/2007 Married Never Smoked Drug use-no Regular exercise-no  Risk Factors: Exercise: no (09/03/2007)  Risk Factors: Smoking Status: never (02/07/2010) Passive Smoke Exposure: no (02/07/2010)  Past medical, surgical, family and social histories (including risk factors) reviewed, and no changes noted (except as noted below).  Past Medical History: Reviewed history from 04/05/2009 and no changes  required. Hypertension Anxiety fungal nail insect bite  Family History: Reviewed history and no changes required.  Social History: Reviewed history from 09/03/2007 and no changes required. Married Never Smoked Drug use-no Regular exercise-no  Review of Systems  The patient denies anorexia, fever, weight loss, weight gain, vision loss, decreased hearing, hoarseness, chest pain, syncope, dyspnea on exertion, peripheral edema, prolonged cough, headaches, hemoptysis, abdominal pain, melena, hematochezia, severe indigestion/heartburn, hematuria, incontinence, genital sores, muscle weakness, suspicious skin lesions, transient blindness, difficulty walking, depression, unusual weight change, abnormal bleeding, enlarged lymph nodes, angioedema, breast masses, and testicular masses.    Physical Exam  General:  Well-developed,well-nourished,in no acute distress; alert,appropriate and cooperative throughout examination Head:  normocephalic and male-pattern balding.   Eyes:  pupils equal and pupils round.  conjunctival injection and excessive tearing.   Ears:  R ear normal and L ear normal.   Nose:  no external deformity and no nasal discharge.   Mouth:  Oral mucosa and oropharynx without lesions or exudates.  Teeth in good repair. Neck:  No deformities, masses, or tenderness noted. Lungs:  normal respiratory effort and no wheezes.   Heart:  normal rate and regular rhythm.     Impression & Recommendations:  Problem # 1:  OTHER MALAISE AND FATIGUE (ICD-780.79) did not get the rilalin filled  Problem # 2:  HYPERTENSION (ICD-401.9)  His updated medication list for this problem includes:    Ziac 5-6.25 Mg Tabs (Bisoprolol-hydrochlorothiazide) ..... Once daily  BP today: 144/90 Prior BP: 140/80 (12/22/2009)  Prior 10 Yr Risk Heart Disease: Not enough information (09/03/2007)  Labs Reviewed: K+: 3.6 (12/22/2009) Creat: : 1.0 (12/22/2009)     Problem # 3:  ACUTE PROSTATITIS  (ICD-601.0) doxy two times a day for 14  Problem # 4:  ATTENTION DEFICIT DISORDER, ADULT (ICD-314.00) and depression needs a trial fo the ritalin  Complete Medication List: 1)  Ziac 5-6.25 Mg Tabs (Bisoprolol-hydrochlorothiazide) .... Once daily 2)  Sudafed 30 Mg Tabs (Pseudoephedrine hcl) .... Once daily as needed 3)  Lotrimin Ultra 1 % Crea (Butenafine hcl) .... Apply to areas bid 4)  Lexapro 10 Mg Tabs (Escitalopram oxalate) .... One by mouth daily 5)  Diazepam 2 Mg Tabs (Diazepam) .... One by mouth bid 6)  Amphetamine-dextroamphetamine 10 Mg Xr24h-cap (Amphetamine-dextroamphetamine) .... One by mouth daily ( will need prior authorization) 7)  Doxycycline Hyclate 100 Mg Caps (Doxycycline hyclate) .... One by mouth two times a day x 14 days  Hypertension Assessment/Plan:      The patient's hypertensive risk group is category B: At least one risk factor (excluding diabetes) with no target organ damage.  Today's blood pressure is 144/90.  His blood pressure goal is < 140/90.  Patient Instructions: 1)  use of  B and L eye drops for allergies 2)  Please schedule a follow-up appointment in 1 month. Prescriptions: DOXYCYCLINE HYCLATE 100 MG CAPS (DOXYCYCLINE HYCLATE) one by mouth two times a day x 14 days  #28 x 0   Entered and Authorized by:   Stacie Glaze MD   Signed by:   Stacie Glaze MD on 02/07/2010   Method used:  Print then Give to Patient   RxID:   [id number] AMPHETAMINE-DEXTROAMPHETAMINE 10 MG XR24H-CAP (AMPHETAMINE-DEXTROAMPHETAMINE) one by mouth daily ( will need prior authorization)  #30 x 0   Entered and Authorized by:   Stacie Glaze MD   Signed by:   Stacie Glaze MD on 02/07/2010   Method used:   Print then Give to Patient   RxID:   [id number]

## 2010-11-10 NOTE — Progress Notes (Signed)
Summary: needs alternative rx  Phone Note Call from Patient Call back at Home Phone [phone] Call back at Work Phone [phone] Call back at [phone] or work # [phone]   Caller: Patient---live call Summary of Call: was given a rx for Adderall 10mg . On manufacturer back order. He went around town looking for some. one pharmacist had Adderall 15mg . please advise pt. he knows that Dr Lovell Sheehan is out. pt stated that he is now out of meds. Initial call taken by: Warnell Forester,  August 01, 2010 8:52 AM  Follow-up for Phone Call        Rx done for Dr. Charm Rings signature.  Wife will pick up.   Follow-up by: Rudy Jew, RN,  August 01, 2010 1:18 PM    New/Updated Medications: ADDERALL 15 MG TABS (AMPHETAMINE-DEXTROAMPHETAMINE) One two times a day early am & 6 hours later Prescriptions: ADDERALL 15 MG TABS (AMPHETAMINE-DEXTROAMPHETAMINE) One two times a day early am & 6 hours later  #60 x 0   Entered by:   Rudy Jew, RN   Authorized by:   Gordy Savers  MD   Signed by:   Rudy Jew, RN on 08/01/2010   Method used:   Print then Give to Patient   RxID:   [id number]  OK to change to 15 mg until 10 mg available

## 2010-11-10 NOTE — Assessment & Plan Note (Signed)
Summary: 3 MTH ROV // RS   Vital Signs:  Patient profile:   50 year old male Height:      73 inches Weight:      246 pounds BMI:     32.57 Temp:     98.2 degrees F oral Pulse rate:   72 / minute Resp:     14 per minute BP sitting:   140 / 80  (left arm)  Vitals Entered By: Willy Eddy, LPN (December 22, 2009 2:34 PM) CC: roa discuss meds, Hypertension Management   CC:  roa discuss meds and Hypertension Management.  History of Present Illness: Mood is stable Feels fatiqued phsucally but mentally feels OK Monday is a rough day for him. loss of focus possible adult ADD? HTN is stable  Hypertension History:      He denies headache, chest pain, palpitations, dyspnea with exertion, orthopnea, PND, peripheral edema, visual symptoms, neurologic problems, syncope, and side effects from treatment.        Positive major cardiovascular risk factors include male age 53 years old or older and hypertension.  Negative major cardiovascular risk factors include negative family history for ischemic heart disease and non-tobacco-user status.     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 Once Daily 2)  Sudafed 30 Mg  Tabs (Pseudoephedrine Hcl) .... Once Daily As Needed 3)  Lotrimin Ultra 1 %  Crea (Butenafine Hcl) .... Apply To Areas Bid 4)  Lexapro 10 Mg Tabs (Escitalopram Oxalate) .... One By Mouth Daily 5)  Diazepam 2 Mg Tabs (Diazepam) .... One By Mouth Bid  Current Medications (verified): 1)  Ziac 5-6.25 Mg  Tabs (Bisoprolol-Hydrochlorothiazide) .... Once Daily 2)  Sudafed 30 Mg  Tabs (Pseudoephedrine Hcl) .... Once Daily As Needed 3)  Lotrimin Ultra 1 %  Crea (Butenafine Hcl) .... Apply To Areas Bid 4)  Lexapro 10 Mg Tabs (Escitalopram Oxalate) .... One By Mouth Daily 5)  Diazepam 2 Mg Tabs (Diazepam) .... One By Mouth Bid 6)  Amphetamine-Dextroamphetamine 10 Mg Xr24h-Cap (Amphetamine-Dextroamphetamine) .... One By Mouth Daily  Allergies (verified): No Known Drug Allergies  Past History:  Social History: Last updated: 09/03/2007 Married Never Smoked Drug use-no Regular exercise-no  Risk Factors: Exercise: no (09/03/2007)  Risk Factors: Smoking Status: never (12/22/2009) Passive Smoke Exposure: no (12/22/2009)  Past medical, surgical, family and social histories (including risk factors) reviewed, and no changes noted (except as noted below).  Past Medical History: Reviewed history from 04/05/2009 and no changes required. Hypertension Anxiety fungal nail insect bite  Family History: Reviewed history and no changes required.  Social History: Reviewed history from 09/03/2007 and no changes required. Married Never Smoked Drug use-no Regular exercise-no  Review of Systems  The patient denies anorexia, fever, weight loss, weight gain, vision loss, decreased hearing, hoarseness, chest pain, syncope, dyspnea on exertion, peripheral edema, prolonged cough, headaches, hemoptysis, abdominal pain, melena,  hematochezia, severe indigestion/heartburn, hematuria, incontinence, genital sores, muscle weakness, suspicious skin lesions, transient blindness, difficulty walking, depression, unusual weight change, abnormal bleeding, enlarged lymph nodes, angioedema, breast masses, and testicular masses.    Physical Exam  General:  Well-developed,well-nourished,in no acute distress; alert,appropriate and cooperative throughout examination Head:  normocephalic and male-pattern balding.   Eyes:  pupils equal and pupils round.   Ears:  R ear normal and L ear normal.   Nose:  no external deformity and no nasal discharge.   Neck:  No deformities, masses, or tenderness noted. Lungs:  normal respiratory effort and no wheezes.   Heart:  normal rate and regular rhythm.   Abdomen:  soft and non-tender.   Genitalia:  circumcised and no urethral discharge.   Prostate:  no gland enlargement and no asymmetry.   Msk:  normal ROM and no joint tenderness.   Neurologic:  alert & oriented X3 and cranial nerves II-XII intact.   Psych:  labile affect and moderately anxious.     Impression & Recommendations:  Problem # 1:  HYPERTENSION (ICD-401.9)  His updated medication list for this problem includes:    Ziac 5-6.25 Mg Tabs (Bisoprolol-hydrochlorothiazide) ..... Once daily  BP today: 140/80 Prior BP: 120/80 (09/15/2009)  Prior 10 Yr Risk Heart Disease: Not enough information (09/03/2007)  Orders: TLB-BMP (Basic Metabolic Panel-BMET) (80048-METABOL)  Problem # 2:  ANXIETY (ICD-300.00) add  ADD drug for loss of focus with pain manifestations in back and abdomin His updated medication list for this problem includes:    Lexapro 10 Mg Tabs (Escitalopram oxalate) ..... One by mouth daily    Diazepam 2 Mg Tabs (Diazepam) ..... One by mouth bid  Discussed medication use and relaxation techniques.   Problem # 3:  OTHER MALAISE AND FATIGUE (ICD-780.79) monitering blood work Orders: TLB-TSH (Thyroid Stimulating  Hormone) (84443-TSH)  Problem # 4:  GASTROESOPHAGEAL REFLUX DISEASE, CHRONIC (ICD-530.81) moniter medications Orders: TLB-CBC Platelet - w/Differential (85025-CBCD) Venipuncture (78295) TLB-Hepatic/Liver Function Pnl (80076-HEPATIC)  Complete Medication List: 1)  Ziac 5-6.25 Mg Tabs (Bisoprolol-hydrochlorothiazide) .... Once daily 2)  Sudafed 30 Mg Tabs (Pseudoephedrine hcl) .... Once daily as needed 3)  Lotrimin Ultra 1 % Crea (Butenafine hcl) .... Apply to areas bid 4)  Lexapro 10 Mg Tabs (Escitalopram oxalate) .... One by mouth daily 5)  Diazepam 2 Mg Tabs (Diazepam) .... One by mouth bid 6)  Amphetamine-dextroamphetamine 10 Mg Xr24h-cap (Amphetamine-dextroamphetamine) .... One by mouth daily  Hypertension Assessment/Plan:      The patient's hypertensive risk group is category B: At least one risk factor (excluding diabetes) with no target organ damage.  Today's blood pressure is 140/80.  His blood pressure goal is < 140/90.  Patient Instructions: 1)  Please schedule a follow-up appointment in 1 month. Prescriptions: AMPHETAMINE-DEXTROAMPHETAMINE 10 MG XR24H-CAP (AMPHETAMINE-DEXTROAMPHETAMINE) one by mouth daily  #30 x 0   Entered and Authorized by:   Stacie Glaze MD   Signed by:   Stacie Glaze MD on 12/22/2009   Method used:   Print then Give to Patient   RxID:   [id number]

## 2010-12-27 ENCOUNTER — Encounter: Payer: Self-pay | Admitting: Internal Medicine

## 2010-12-28 ENCOUNTER — Encounter: Payer: Self-pay | Admitting: Internal Medicine

## 2010-12-29 ENCOUNTER — Telehealth: Payer: Self-pay | Admitting: Internal Medicine

## 2010-12-29 MED ORDER — METHYLPHENIDATE HCL ER (CD) 20 MG PO CPCR: 20.0000 mg | ORAL_CAPSULE | ORAL | Status: DC

## 2010-12-29 NOTE — Progress Notes (Signed)
This encounter was created in error - please disregard.

## 2010-12-29 NOTE — Telephone Encounter (Signed)
Pt needs new rx generic ritalin 20 mg °

## 2010-12-29 NOTE — Telephone Encounter (Signed)
Pt informed ready for pick up 

## 2010-12-30 ENCOUNTER — Telehealth: Payer: Self-pay | Admitting: Internal Medicine

## 2010-12-30 DIAGNOSIS — F909 Attention-deficit hyperactivity disorder, unspecified type: Secondary | ICD-10-CM

## 2010-12-30 MED ORDER — METHYLPHENIDATE HCL 20 MG PO TBCR: 20.0000 mg | EXTENDED_RELEASE_TABLET | ORAL | Status: AC

## 2010-12-30 NOTE — Telephone Encounter (Signed)
Done- pt will pick up monday

## 2010-12-30 NOTE — Telephone Encounter (Signed)
Please return her call regarding his Ritalin rx. A different one was rx'd .

## 2011-01-11 ENCOUNTER — Ambulatory Visit (INDEPENDENT_AMBULATORY_CARE_PROVIDER_SITE_OTHER): Payer: Self-pay | Admitting: Family Medicine

## 2011-01-11 ENCOUNTER — Encounter: Payer: Self-pay | Admitting: Family Medicine

## 2011-01-11 VITALS — BP 120/90 | Temp 98.9°F

## 2011-01-11 DIAGNOSIS — T169XXA Foreign body in ear, unspecified ear, initial encounter: Secondary | ICD-10-CM

## 2011-01-11 DIAGNOSIS — H6120 Impacted cerumen, unspecified ear: Secondary | ICD-10-CM

## 2011-01-11 DIAGNOSIS — H00019 Hordeolum externum unspecified eye, unspecified eyelid: Secondary | ICD-10-CM

## 2011-01-11 NOTE — Progress Notes (Signed)
  Subjective:    Patient ID: Grant Bonilla, male    DOB: [birth year], 50 y.o.   MRN: [id number]  HPI Here for 2 issues. First he was cleaning the left ear with a Q-Tip one week ago, and the tip of this broke off in the ear canal. He has been unable to get this out. It is mildly painful now, and his hearing is a bit decreased. Second, he was here 3 months ago for a stye in the right upper eyelid. He was given Polysporin ointment, and this worked well to stop the discomfort. However the eyelid has been swollen ever since.    Review of Systems  Constitutional: Negative.   HENT: Positive for hearing loss and ear pain.   Eyes: Negative for pain, redness and visual disturbance.       Objective:   Physical Exam  Constitutional: He appears well-developed and well-nourished.  HENT:  Head: Normocephalic and atraumatic.  Right Ear: External ear normal.  Nose: Nose normal.  Mouth/Throat: Oropharynx is clear and moist. No oropharyngeal exudate.       The left ear canal is full of cerumen  Eyes: Conjunctivae are normal. Pupils are equal, round, and reactive to light.       The right upper lid has a nontender firm nodule along the eyelash line           Assessment & Plan:  The ear canal was flushed clear of cotton fibers and cerumen with water. He has a stye that has scarred down and become chronic. This needs to be excised. He will find an ophthalmologist in [place] to set this up

## 2011-01-16 ENCOUNTER — Telehealth: Payer: Self-pay | Admitting: *Deleted

## 2011-01-16 NOTE — Telephone Encounter (Signed)
[facility] Triage Call Report Triage Record Num: [id number] Operator: Patriciaann Clan Patient Name: Grant Bonilla Call Date & Time: 01/13/2011 10:36:34AM Patient Phone: [phone] PCP: Darryll Capers Patient Gender: Male PCP Fax : [phone] Patient DOB: 1961-06-05 [facility] Name[facility] Reason for Call: Patient calling. States he noted raised, hard, red area on buttock. States center appears black in color. States approx. size of a quarter. States area tender to touch. States fever per tactile 01/12/11, has not checked temperature. No drainage. Triage per Skin Lesions Protocol. Emergent sx ruled out. Care advice given per guidelines. Patient advised warm compresses, Ibuprofen 600mg . q 6 hours prn for pain, take with food. Patient advsied to be seen in [facility] within 4 hours. Patient advised not to drive self. Patient verbalizes understanding and agreeable. Protocol(s) Used: Skin Lesions Recommended Outcome per Protocol: See Provider within 4 hours Reason for Outcome: Any skin lesion with signs and symptoms of worsening infection (quickly getting larger, becoming more painful, purulent drainage, new fever not improving with treatment) Care Advice: ~ SYMPTOM / CONDITION MANAGEMENT 01/13/2011 10:50:20AM Page 1 of 1 [id number]

## 2011-02-08 ENCOUNTER — Telehealth: Payer: Self-pay | Admitting: Internal Medicine

## 2011-02-08 MED ORDER — METHYLPHENIDATE HCL 20 MG PO TABS: 20.0000 mg | ORAL_TABLET | Freq: Every day | ORAL | Status: DC

## 2011-02-08 MED ORDER — METHYLPHENIDATE HCL 20 MG PO TABS: 20.0000 mg | ORAL_TABLET | Freq: Every day | ORAL | Status: AC

## 2011-02-08 NOTE — Telephone Encounter (Signed)
Pt called and is req script for generic Ritalin 20mg . Pt has ov sch for next Friday 02/17/11 at 1:30, but needs this med before then.

## 2011-02-17 ENCOUNTER — Ambulatory Visit: Payer: Self-pay | Admitting: Internal Medicine

## 2011-02-24 NOTE — Assessment & Plan Note (Signed)
[facility]                                   ON-CALL NOTE   NAME:Grant Bonilla, Grant Bonilla                         MRN:          [id number]  DATE:07/29/2006                            DOB:          1961/02/11    CALLER:  Grant Bonilla   PHONE NUMBER:  [phone]   SUBJECTIVE:  Starting earlier today the patient began having gas and central  abdominal cramping, then this was followed by diarrhea and then finally  vomiting.  He is able to keep down liquids.  He denies fever, chills,  dizziness.  No blood in the vomit or stool.   ASSESSMENT AND PLAN:  Viral gastroenteritis:  The patient and the patient's  wife were encouraged to keep him well hydrated with small sips of liquids.  They may use ibuprofen or Tylenol as needed for fever above 101.4.  If he is  unable to keep liquids down, does not have a urine output in 12 hours, or if  the abdominal pain gets worse he was instructed to go to either an urgent  care or the emergency room.       Grant Nora, MD      AB/MedQ  DD:  07/29/2006  DT:  07/30/2006  Job #:  [id number]

## 2011-03-15 ENCOUNTER — Ambulatory Visit: Payer: Self-pay | Admitting: Family Medicine

## 2011-03-15 ENCOUNTER — Encounter: Payer: Self-pay | Admitting: Family Medicine

## 2011-03-15 ENCOUNTER — Ambulatory Visit (INDEPENDENT_AMBULATORY_CARE_PROVIDER_SITE_OTHER): Payer: PRIVATE HEALTH INSURANCE | Admitting: Family Medicine

## 2011-03-15 DIAGNOSIS — R079 Chest pain, unspecified: Secondary | ICD-10-CM

## 2011-03-15 DIAGNOSIS — R5383 Other fatigue: Secondary | ICD-10-CM

## 2011-03-15 LAB — CBC WITH DIFFERENTIAL/PLATELET
Basophils Absolute: 0 10*3/uL (ref 0.0–0.1)
Eosinophils Absolute: 0.1 10*3/uL (ref 0.0–0.7)
HCT: 44.3 % (ref 39.0–52.0)
Hemoglobin: 15 g/dL (ref 13.0–17.0)
Lymphs Abs: 1.6 10*3/uL (ref 0.7–4.0)
MCHC: 33.9 g/dL (ref 30.0–36.0)
Monocytes Absolute: 0.6 10*3/uL (ref 0.1–1.0)
Monocytes Relative: 7.5 % (ref 3.0–12.0)
Neutro Abs: 5.1 10*3/uL (ref 1.4–7.7)
Platelets: 175 10*3/uL (ref 150.0–400.0)
RDW: 13.6 % (ref 11.5–14.6)

## 2011-03-15 LAB — BASIC METABOLIC PANEL
CO2: 32 mEq/L (ref 19–32)
Calcium: 9.9 mg/dL (ref 8.4–10.5)
Creatinine, Ser: 1.2 mg/dL (ref 0.4–1.5)
GFR: 70.63 mL/min (ref >60.00)
Sodium: 141 mEq/L (ref 135–145)

## 2011-03-15 LAB — TSH: TSH: 0.95 u[IU]/mL (ref 0.35–5.50)

## 2011-03-15 NOTE — Patient Instructions (Signed)
Follow up immediately for any recurrent or progressive chest pain.

## 2011-03-15 NOTE — Progress Notes (Signed)
  Subjective:    Patient ID: Grant Bonilla, male    DOB: Aug 25, 1961, 50 y.o.   MRN: [id number]  HPI Patient seen as a work in with onset of atypical chest pain yesterday at work. He was at rest and developed right-sided chest discomfort. Both sharp and heavy quality. Duration approximately 5-10 minutes. Felt somewhat clammy and diaphoretic and lightheaded afterwards. No clear syncope. Was given 1 aspirin. EMS arrived. CBG 115. Blood pressure normal. EKG unremarkable. Patient declined hospital evaluation. No recent exertional symptoms. Occasional GERD symptoms. Relates stress test about 6 years ago normal.  He's had no recent exertional symptoms. No left chest symptoms. No family history of premature CAD. Nonsmoker. No history of diabetes. Takes Ziac for hypertension. No orthostatic symptoms.  Also complains of some generalized fatigue and lethargy over several weeks. History of depression and anxiety the symptoms are stable.   Review of Systems  Constitutional: Positive for fatigue. Negative for fever, chills, activity change and unexpected weight change.  HENT: Negative for congestion and trouble swallowing.   Respiratory: Negative for shortness of breath and wheezing.   Cardiovascular: Positive for chest pain. Negative for palpitations and leg swelling.  Genitourinary: Negative for dysuria.  Neurological: Positive for weakness. Negative for seizures, syncope and headaches.  Hematological: Negative for adenopathy.  Psychiatric/Behavioral: Negative for confusion and dysphoric mood.       Objective:   Physical Exam  Constitutional: He is oriented to person, place, and time. He appears well-developed and well-nourished. No distress.  HENT:  Head: Normocephalic and atraumatic.  Right Ear: External ear normal.  Left Ear: External ear normal.  Mouth/Throat: Oropharynx is clear and moist.  Neck: Neck supple. No thyromegaly present.  Cardiovascular: Normal rate, regular rhythm and normal  heart sounds.   No murmur heard. Pulmonary/Chest: Effort normal and breath sounds normal. No respiratory distress. He has no wheezes. He has no rales.  Musculoskeletal: He exhibits no edema.  Lymphadenopathy:    He has no cervical adenopathy.  Neurological: He is alert and oriented to person, place, and time. No cranial nerve deficit.  Skin: No rash noted.  Psychiatric: He has a normal mood and affect. His behavior is normal.          Assessment & Plan:  #1 atypical chest pain. Atypical features included resting right-sided, sharp quality, sometimes at rest. EKG today normal sinus rhythm with no acute abnormality. Observe for now. Consider stress testing if any recurrence #2 generalized fatigue and malaise. Check labs with CBC, basic metabolic panel, and TSH

## 2011-03-16 NOTE — Progress Notes (Signed)
Quick Note:  Pt informed on VM, if he was fasting, please call and let us know. If not fasting, no need to call back ______

## 2011-03-17 ENCOUNTER — Ambulatory Visit: Payer: Self-pay | Admitting: Internal Medicine

## 2011-03-30 ENCOUNTER — Encounter: Payer: Self-pay | Admitting: Internal Medicine

## 2011-03-30 ENCOUNTER — Ambulatory Visit (INDEPENDENT_AMBULATORY_CARE_PROVIDER_SITE_OTHER): Payer: PRIVATE HEALTH INSURANCE | Admitting: Internal Medicine

## 2011-03-30 VITALS — BP 144/84 | HR 76 | Temp 98.2°F | Resp 16 | Ht 73.0 in | Wt 252.0 lb

## 2011-03-30 DIAGNOSIS — I10 Essential (primary) hypertension: Secondary | ICD-10-CM

## 2011-03-30 DIAGNOSIS — F411 Generalized anxiety disorder: Secondary | ICD-10-CM

## 2011-03-30 DIAGNOSIS — K219 Gastro-esophageal reflux disease without esophagitis: Secondary | ICD-10-CM

## 2011-03-30 DIAGNOSIS — F332 Major depressive disorder, recurrent severe without psychotic features: Secondary | ICD-10-CM

## 2011-03-30 MED ORDER — AMPHETAMINE-DEXTROAMPHETAMINE 15 MG PO TABS: 15.0000 mg | ORAL_TABLET | Freq: Two times a day (BID) | ORAL | Status: DC

## 2011-03-30 MED ORDER — VILAZODONE HCL 40 MG PO TABS: 1.0000 | ORAL_TABLET | Freq: Two times a day (BID) | ORAL | Status: DC

## 2011-03-30 NOTE — Progress Notes (Signed)
  Subjective:    Patient ID: Grant Bonilla, male    DOB: 02-27-1961, 50 y.o.   MRN: [id number]  HPI noticing rapid mood swings Currently on vybryd 40 once a day Takes generic ritalin Felt better on adderal    Review of Systems  Constitutional: Negative for fever and fatigue.  HENT: Negative for hearing loss, congestion, neck pain and postnasal drip.   Eyes: Negative for discharge, redness and visual disturbance.  Respiratory: Negative for cough, shortness of breath and wheezing.   Cardiovascular: Negative for leg swelling.  Gastrointestinal: Negative for abdominal pain, constipation and abdominal distention.  Genitourinary: Negative for urgency and frequency.  Musculoskeletal: Negative for joint swelling and arthralgias.  Skin: Negative for color change and rash.  Neurological: Negative for weakness and light-headedness.  Hematological: Negative for adenopathy.  Psychiatric/Behavioral: Negative for behavioral problems.   Past Medical History  Diagnosis Date  . Hypertension   . Anxiety   . Nail fungal infection   . Insect bite    History reviewed. No pertinent past surgical history.  reports that he has never smoked. He does not have any smokeless tobacco history on file. He reports that he does not drink alcohol or use illicit drugs. family history is not on file. No Known Allergies      Objective:   Physical Exam  Constitutional: He appears well-developed and well-nourished.  HENT:  Head: Normocephalic and atraumatic.  Eyes: Conjunctivae are normal. Pupils are equal, round, and reactive to light.  Neck: Normal range of motion. Neck supple.  Cardiovascular: Normal rate and regular rhythm.   Pulmonary/Chest: Effort normal and breath sounds normal.  Abdominal: Soft. Bowel sounds are normal.          Assessment & Plan:  Continue current medications

## 2011-04-03 ENCOUNTER — Telehealth: Payer: Self-pay | Admitting: Internal Medicine

## 2011-04-03 NOTE — Telephone Encounter (Signed)
Was given an Adderall rx last week. Too expensive. Would like to go back on Ritalin. Pt will bring Adderall rx back to tomorrow when the Ritalin rx is ready.

## 2011-04-03 NOTE — Telephone Encounter (Signed)
Please advise 

## 2011-04-04 ENCOUNTER — Other Ambulatory Visit: Payer: Self-pay | Admitting: *Deleted

## 2011-04-05 ENCOUNTER — Other Ambulatory Visit: Payer: Self-pay | Admitting: *Deleted

## 2011-04-05 ENCOUNTER — Telehealth: Payer: Self-pay | Admitting: Internal Medicine

## 2011-04-05 MED ORDER — METHYLPHENIDATE HCL ER (LA) 20 MG PO CP24: 20.0000 mg | ORAL_CAPSULE | ORAL | Status: DC

## 2011-04-05 NOTE — Telephone Encounter (Signed)
Pt notified, ready for pick and will bring adderall scripts with him

## 2011-04-05 NOTE — Telephone Encounter (Signed)
Pt called to check on status of Ritalin? Adderall too expensive.

## 2011-04-05 NOTE — Telephone Encounter (Signed)
Pt called back again to req status of Ritalin. Pts wife would like to pick up script today around 1 pm.

## 2011-04-06 ENCOUNTER — Telehealth: Payer: Self-pay | Admitting: Internal Medicine

## 2011-04-06 NOTE — Telephone Encounter (Signed)
Pt called 6/28. He needs some clarification on his ADD meds. States his Rx that Dr. Lovell Sheehan gave him is not the same that he had been taking. Please call him to advise. He is going out of town next week and would like resolution before he leaves.

## 2011-04-07 ENCOUNTER — Other Ambulatory Visit: Payer: Self-pay | Admitting: *Deleted

## 2011-04-07 MED ORDER — METHYLPHENIDATE HCL 20 MG PO TABS: 20.0000 mg | ORAL_TABLET | Freq: Every day | ORAL | Status: DC

## 2011-04-07 NOTE — Telephone Encounter (Signed)
P[t informed redone- I called pharmacy and asked what was ordered in the past

## 2011-05-08 ENCOUNTER — Telehealth: Payer: Self-pay | Admitting: Internal Medicine

## 2011-05-08 MED ORDER — METHYLPHENIDATE HCL 20 MG PO TABS: 20.0000 mg | ORAL_TABLET | Freq: Every day | ORAL | Status: DC

## 2011-05-08 NOTE — Telephone Encounter (Signed)
Pt wants to go back on the methylphenidate, the cheaper one.

## 2011-05-08 NOTE — Telephone Encounter (Signed)
Pt to pick up am

## 2011-05-19 ENCOUNTER — Telehealth: Payer: Self-pay | Admitting: *Deleted

## 2011-05-19 NOTE — Telephone Encounter (Signed)
Pt is calling stating he feels like he is passing a kidney stone, and does not feel like driving all the way here.  He will go to an Urgent Care and be evaluated.  Dr. Lovell Sheehan aware.

## 2011-06-07 ENCOUNTER — Encounter: Payer: Self-pay | Admitting: Internal Medicine

## 2011-06-07 ENCOUNTER — Ambulatory Visit (INDEPENDENT_AMBULATORY_CARE_PROVIDER_SITE_OTHER): Payer: PRIVATE HEALTH INSURANCE | Admitting: Internal Medicine

## 2011-06-07 VITALS — BP 146/90 | HR 80 | Temp 98.2°F | Resp 16 | Ht 73.0 in | Wt 252.0 lb

## 2011-06-07 DIAGNOSIS — T887XXA Unspecified adverse effect of drug or medicament, initial encounter: Secondary | ICD-10-CM

## 2011-06-07 DIAGNOSIS — F329 Major depressive disorder, single episode, unspecified: Secondary | ICD-10-CM

## 2011-06-07 DIAGNOSIS — G4710 Hypersomnia, unspecified: Secondary | ICD-10-CM

## 2011-06-07 DIAGNOSIS — G4714 Hypersomnia due to medical condition: Secondary | ICD-10-CM

## 2011-06-07 MED ORDER — ARMODAFINIL 150 MG PO TABS: 150.0000 mg | ORAL_TABLET | Freq: Every day | ORAL | Status: AC

## 2011-06-07 NOTE — Progress Notes (Signed)
  Subjective:    Patient ID: Grant Bonilla, male    DOB: [birth year], 50 y.o.   MRN: [id number]  HPI Pt has extreme lethargy and persistant mood swings Energy is los Anger issues have resolved Transient suicidal thought without ideation He has mild paranoid thoughts that others see his depression Has been on the viibryd for one year Increased dose 90 days to 40 BID    Review of Systems  Constitutional: Negative for fever and fatigue.  HENT: Negative for hearing loss, congestion, neck pain and postnasal drip.   Eyes: Negative for discharge, redness and visual disturbance.  Respiratory: Negative for cough, shortness of breath and wheezing.   Cardiovascular: Negative for leg swelling.  Gastrointestinal: Negative for abdominal pain, constipation and abdominal distention.  Genitourinary: Negative for urgency and frequency.  Musculoskeletal: Negative for joint swelling and arthralgias.  Skin: Negative for color change and rash.  Neurological: Negative for weakness and light-headedness.  Hematological: Negative for adenopathy.  Psychiatric/Behavioral: Positive for dysphoric mood and agitation. Negative for behavioral problems. The patient is nervous/anxious.    Past Medical History  Diagnosis Date  . Hypertension   . Anxiety   . Nail fungal infection   . Insect bite    No past surgical history on file.  reports that he has never smoked. He does not have any smokeless tobacco history on file. He reports that he does not drink alcohol or use illicit drugs. family history is not on file. No Known Allergies      Objective:   Physical Exam  Constitutional: He appears well-developed and well-nourished.  HENT:  Head: Normocephalic and atraumatic.  Eyes: Conjunctivae are normal. Pupils are equal, round, and reactive to light.  Neck: Normal range of motion. Neck supple.  Cardiovascular: Normal rate and regular rhythm.   Pulmonary/Chest: Effort normal and breath sounds normal.    Abdominal: Soft. Bowel sounds are normal.  Psychiatric: His mood appears anxious. He is agitated. He is is not hyperactive. Thought content is paranoid. He exhibits a depressed mood. He expresses no suicidal ideation. He expresses no suicidal plans.          Assessment & Plan:  Along with continuing the current antidepressant at its current dose we will discontinue the use of amphetamine class drugs because of the potential for rebound depression in this patient for his hypersomnolence and difficulty with work and concentration we will provide individual 150 mg by mouth daily having looked up the potential interactions and side effects the only listed interaction seen or drug drug interaction site was that of the cytochrome 4 drug metabolism.  We'll monitor carefully for effect and side effect and he will come back for office in 2 months but notify us of any abnormal side effects or worsening of his symptomatology specifically any increasing suicidal thought or ideology

## 2011-06-08 ENCOUNTER — Other Ambulatory Visit: Payer: Self-pay | Admitting: Internal Medicine

## 2011-08-18 ENCOUNTER — Ambulatory Visit: Payer: PRIVATE HEALTH INSURANCE | Admitting: Internal Medicine

## 2011-08-22 ENCOUNTER — Encounter: Payer: Self-pay | Admitting: Internal Medicine

## 2011-08-22 ENCOUNTER — Ambulatory Visit (INDEPENDENT_AMBULATORY_CARE_PROVIDER_SITE_OTHER): Payer: PRIVATE HEALTH INSURANCE | Admitting: Internal Medicine

## 2011-08-22 ENCOUNTER — Other Ambulatory Visit: Payer: Self-pay | Admitting: *Deleted

## 2011-08-22 VITALS — BP 140/84 | HR 76 | Temp 98.2°F | Resp 16 | Ht 74.0 in | Wt 242.0 lb

## 2011-08-22 DIAGNOSIS — R5381 Other malaise: Secondary | ICD-10-CM

## 2011-08-22 DIAGNOSIS — Z23 Encounter for immunization: Secondary | ICD-10-CM

## 2011-08-22 DIAGNOSIS — I10 Essential (primary) hypertension: Secondary | ICD-10-CM

## 2011-08-22 DIAGNOSIS — F332 Major depressive disorder, recurrent severe without psychotic features: Secondary | ICD-10-CM

## 2011-08-22 DIAGNOSIS — R5383 Other fatigue: Secondary | ICD-10-CM

## 2011-08-22 MED ORDER — VALACYCLOVIR HCL 500 MG PO TABS: 500.0000 mg | ORAL_TABLET | Freq: Three times a day (TID) | ORAL | Status: AC

## 2011-08-22 NOTE — Patient Instructions (Signed)
The patient is instructed to continue all medications as prescribed. Schedule followup with check out clerk upon leaving the clinic  

## 2011-08-23 NOTE — Progress Notes (Signed)
  Subjective:    Patient ID: Grant Bonilla, male    DOB: Dec 25, 1960, 50 y.o.   MRN: [id number]  HPI The patient had been using ADD drugs but recently discovered that his low for sweets was part of the problem with his attention deficit disorder when he eliminated sweets from his diet he was able to lose weight and felt that his concentration improved dramatically.  We discussed with the patient recent literature suggesting that diet hasn't in fact on ADD and the fact that the limiting concentrated sweets from his diet probably was an effective therapeutic intervention he believes that he no longer needs to have the ADD drugs but does need to continue his antidepressant therapy.     Review of Systems  Constitutional: Negative for fever and fatigue.  HENT: Negative for hearing loss, congestion, neck pain and postnasal drip.   Eyes: Negative for discharge, redness and visual disturbance.  Respiratory: Negative for cough, shortness of breath and wheezing.   Cardiovascular: Negative for leg swelling.  Gastrointestinal: Negative for abdominal pain, constipation and abdominal distention.  Genitourinary: Negative for urgency and frequency.  Musculoskeletal: Negative for joint swelling and arthralgias.  Skin: Negative for color change and rash.  Neurological: Negative for weakness and light-headedness.  Hematological: Negative for adenopathy.  Psychiatric/Behavioral: Negative for behavioral problems.   Past Medical History  Diagnosis Date  . Hypertension   . Anxiety   . Nail fungal infection   . Insect bite    No past surgical history on file.  reports that he has never smoked. He does not have any smokeless tobacco history on file. He reports that he does not drink alcohol or use illicit drugs. family history is not on file. No Known Allergies     Objective:   Physical Exam  Nursing note and vitals reviewed. Constitutional: He appears well-developed and well-nourished.  HENT:    Head: Normocephalic and atraumatic.  Eyes: Conjunctivae are normal. Pupils are equal, round, and reactive to light.  Neck: Normal range of motion. Neck supple.  Cardiovascular: Normal rate and regular rhythm.   Pulmonary/Chest: Effort normal and breath sounds normal.  Abdominal: Soft. Bowel sounds are normal.          Assessment & Plan:  ADD symptoms appear to be treated by diet lemonade sugars would concur with continuing with this therapeutic intervention.  Refill of the fibrin and samples to aid in compliance is given to the patient today has tolerated medication well without side effects it seemed to treat more effectively his depression and anxiety than any other regimen he has been on his blood pressure is stable and improved with his weight loss to cervical with additional 20 pounds in the next 6 months

## 2011-11-22 ENCOUNTER — Ambulatory Visit: Payer: PRIVATE HEALTH INSURANCE | Admitting: Internal Medicine

## 2011-12-11 ENCOUNTER — Telehealth: Payer: Self-pay | Admitting: Internal Medicine

## 2011-12-11 NOTE — Telephone Encounter (Signed)
Samples for VIIBRYD- do we have any?

## 2011-12-12 ENCOUNTER — Other Ambulatory Visit: Payer: Self-pay | Admitting: *Deleted

## 2011-12-12 ENCOUNTER — Ambulatory Visit: Payer: PRIVATE HEALTH INSURANCE | Admitting: Internal Medicine

## 2011-12-12 NOTE — Telephone Encounter (Signed)
Left message on machine Samples are ready for pick up

## 2012-01-27 ENCOUNTER — Other Ambulatory Visit: Payer: Self-pay | Admitting: Internal Medicine

## 2012-02-01 ENCOUNTER — Telehealth: Payer: Self-pay | Admitting: Internal Medicine

## 2012-02-01 NOTE — Telephone Encounter (Signed)
Pt called and rschd their ov to 04/08/12 at 10:15am. Pt said that he will need to get some samples of Samples for VIIBRYD to last until his appt date. Pls notify pt when ready for pick up.

## 2012-02-01 NOTE — Telephone Encounter (Signed)
Left message on machine Samples out front 

## 2012-02-02 ENCOUNTER — Ambulatory Visit: Payer: PRIVATE HEALTH INSURANCE | Admitting: Internal Medicine

## 2012-03-13 ENCOUNTER — Telehealth: Payer: Self-pay | Admitting: Family Medicine

## 2012-03-13 MED ORDER — VALACYCLOVIR HCL 500 MG PO TABS: 500.0000 mg | ORAL_TABLET | Freq: Three times a day (TID) | ORAL | Status: AC

## 2012-03-13 NOTE — Telephone Encounter (Signed)
Pulled from Triage vmail. Pt called at 9:33. Has appt with Dr. Shela Commons in July, however would like a Rx for Valtrex called in now, to the [facility] in [facility]. Please advise. Thanks.

## 2012-03-13 NOTE — Telephone Encounter (Signed)
Ok per dr jenkins 

## 2012-03-13 NOTE — Telephone Encounter (Signed)
Left message on machine For pt that it was called in

## 2012-04-08 ENCOUNTER — Ambulatory Visit (INDEPENDENT_AMBULATORY_CARE_PROVIDER_SITE_OTHER): Payer: PRIVATE HEALTH INSURANCE | Admitting: Internal Medicine

## 2012-04-08 ENCOUNTER — Encounter: Payer: Self-pay | Admitting: Internal Medicine

## 2012-04-08 VITALS — BP 140/90 | HR 76 | Temp 98.2°F | Resp 16 | Ht 74.0 in | Wt 242.0 lb

## 2012-04-08 DIAGNOSIS — T887XXA Unspecified adverse effect of drug or medicament, initial encounter: Secondary | ICD-10-CM

## 2012-04-08 DIAGNOSIS — L719 Rosacea, unspecified: Secondary | ICD-10-CM

## 2012-04-08 DIAGNOSIS — I10 Essential (primary) hypertension: Secondary | ICD-10-CM

## 2012-04-08 DIAGNOSIS — F332 Major depressive disorder, recurrent severe without psychotic features: Secondary | ICD-10-CM

## 2012-04-08 LAB — COMPREHENSIVE METABOLIC PANEL
Alkaline Phosphatase: 48 U/L (ref 39–117)
Glucose, Bld: 105 mg/dL — ABNORMAL HIGH (ref 70–99)
Sodium: 140 mEq/L (ref 135–145)
Total Bilirubin: 0.6 mg/dL (ref 0.3–1.2)
Total Protein: 7.4 g/dL (ref 6.0–8.3)

## 2012-04-08 MED ORDER — DOXYCYCLINE HYCLATE 100 MG PO TABS: 100.0000 mg | ORAL_TABLET | Freq: Two times a day (BID) | ORAL | Status: AC

## 2012-04-08 NOTE — Patient Instructions (Addendum)
The patient is instructed to continue all medications as prescribed. Schedule followup with check out clerk upon leaving the clinic  You may need a mood stabilizer and a psychiatrist can help [place] decide.

## 2012-04-08 NOTE — Progress Notes (Signed)
  Subjective:    Patient ID: Grant Bonilla, male    DOB: 22-Jul-1961, 51 y.o.   MRN: [id number]  HPI  Rash on face, tender , pustules Mood variations, considering psychiatry referral No suicidal ideation  Review of Systems  Constitutional: Negative for fever and fatigue.  HENT: Negative for hearing loss, congestion, neck pain and postnasal drip.   Eyes: Negative for discharge, redness and visual disturbance.  Respiratory: Negative for cough, shortness of breath and wheezing.   Cardiovascular: Negative for leg swelling.  Gastrointestinal: Negative for abdominal pain, constipation and abdominal distention.  Genitourinary: Negative for urgency and frequency.  Musculoskeletal: Negative for joint swelling and arthralgias.  Skin: Negative for color change and rash.  Neurological: Negative for weakness and light-headedness.  Hematological: Negative for adenopathy.  Psychiatric/Behavioral: Negative for behavioral problems.       Objective:   Physical Exam  Nursing note reviewed. Constitutional: He appears well-developed and well-nourished.  HENT:  Head: Normocephalic and atraumatic.  Eyes: Conjunctivae are normal. Pupils are equal, round, and reactive to light.  Neck: Normal range of motion. Neck supple.  Cardiovascular: Normal rate and regular rhythm.   Pulmonary/Chest: Effort normal and breath sounds normal.  Abdominal: Soft. Bowel sounds are normal.          Assessment & Plan:  Mood continue the medication until you see the psychiatrist Treat rosacea with the doxy.

## 2012-04-09 ENCOUNTER — Other Ambulatory Visit: Payer: Self-pay | Admitting: *Deleted

## 2012-04-09 LAB — CBC WITH DIFFERENTIAL/PLATELET
Basophils Relative: 0.5 % (ref 0.0–3.0)
Eosinophils Absolute: 0.1 10*3/uL (ref 0.0–0.7)
Eosinophils Relative: 2 % (ref 0.0–5.0)
HCT: 45.4 % (ref 39.0–52.0)
Lymphs Abs: 2 10*3/uL (ref 0.7–4.0)
MCHC: 33.5 g/dL (ref 30.0–36.0)
MCV: 85.6 fl (ref 78.0–100.0)
Monocytes Absolute: 0.5 10*3/uL (ref 0.1–1.0)
Platelets: 192 10*3/uL (ref 150.0–400.0)
RBC: 5.31 Mil/uL (ref 4.22–5.81)
WBC: 7.4 10*3/uL (ref 4.5–10.5)

## 2012-04-09 MED ORDER — AZITHROMYCIN 250 MG PO TABS: 250.0000 mg | ORAL_TABLET | Freq: Every day | ORAL | Status: AC

## 2012-04-29 ENCOUNTER — Ambulatory Visit (INDEPENDENT_AMBULATORY_CARE_PROVIDER_SITE_OTHER): Payer: 59 | Admitting: Family

## 2012-04-29 ENCOUNTER — Encounter: Payer: Self-pay | Admitting: Family

## 2012-04-29 VITALS — BP 130/90 | HR 74 | Temp 98.5°F | Wt 243.0 lb

## 2012-04-29 DIAGNOSIS — L0390 Cellulitis, unspecified: Secondary | ICD-10-CM

## 2012-04-29 DIAGNOSIS — L0291 Cutaneous abscess, unspecified: Secondary | ICD-10-CM

## 2012-04-29 DIAGNOSIS — IMO0001 Reserved for inherently not codable concepts without codable children: Secondary | ICD-10-CM

## 2012-04-29 DIAGNOSIS — M7918 Myalgia, other site: Secondary | ICD-10-CM

## 2012-04-29 MED ORDER — SULFAMETHOXAZOLE-TRIMETHOPRIM 800-160 MG PO TABS: 1.0000 | ORAL_TABLET | Freq: Two times a day (BID) | ORAL | Status: AC

## 2012-04-29 NOTE — Patient Instructions (Signed)

## 2012-04-29 NOTE — Progress Notes (Signed)
  Subjective:    Patient ID: Grant Bonilla, male    DOB: 1961-02-02, 51 y.o.   MRN: [id number]  HPI 51 year old white male, nonsmoker, patient of Dr. Lovell Sheehan is in today with a rash on his left buttocks x1 week. Patient reports getting this rash about 4 times in the last 10 years. He thinks it is as a result of a spider bite 10 years ago. He has not applied any medication. He typically treated with an antibiotic in the rash resolves. Reports having fatigue, feeling flushed, body aches and chills. Denies fever.   Review of Systems  Constitutional: Negative.   Respiratory: Negative.   Cardiovascular: Negative.   Gastrointestinal: Negative.   Skin: Positive for rash.       Rash to the left buttocks, tender to touch.  Neurological: Negative.   Hematological: Negative.   Psychiatric/Behavioral: Negative.    Past Medical History  Diagnosis Date  . Hypertension   . Anxiety   . Nail fungal infection   . Insect bite     History   Social History  . Marital Status: Married    Spouse Name: N/A    Number of Children: N/A  . Years of Education: N/A   Occupational History  . Not on file.   Social History Main Topics  . Smoking status: Never Smoker   . Smokeless tobacco: Not on file  . Alcohol Use: No  . Drug Use: No  . Sexually Active: Yes   Other Topics Concern  . Not on file   Social History Narrative  . No narrative on file    No past surgical history on file.  No family history on file.  No Known Allergies  Current Outpatient Prescriptions on File Prior to Visit  Medication Sig Dispense Refill  . bisoprolol-hydrochlorothiazide (ZIAC) 5-6.25 MG per tablet TAKE ONE TABLET BY MOUTH ONE TIME DAILY  30 tablet  5  . pseudoephedrine (SUDAFED) 30 MG tablet Take 30 mg by mouth daily as needed.        . valACYclovir (VALTREX) 500 MG tablet Take 1 tablet (500 mg total) by mouth 3 (three) times daily.  21 tablet  0  . Vilazodone HCl (VIIBRYD) 40 MG TABS Take 1 tablet by mouth  2 (two) times daily.  60 tablet  3    BP 130/90  Pulse 74  Temp 98.5 F (36.9 C) (Oral)  Wt 243 lb (110.224 kg)  SpO2 95%chart    Objective:   Physical Exam  Constitutional: He is oriented to person, place, and time. He appears well-developed and well-nourished.  HENT:  Right Ear: External ear normal.  Left Ear: External ear normal.  Nose: Nose normal.  Mouth/Throat: Oropharynx is clear and moist.  Neck: Normal range of motion. Neck supple.  Cardiovascular: Normal rate, regular rhythm and normal heart sounds.   Pulmonary/Chest: Effort normal and breath sounds normal.  Musculoskeletal: Normal range of motion.  Neurological: He is alert and oriented to person, place, and time.  Skin: Skin is warm and dry.       Rash to the left gluteal. Vesicular in nature. The lesions are bluish black in color with surrounding erythema.  Psychiatric: He has a normal mood and affect.          Assessment & Plan:  Assessment: Cellulitis, possible herpes  Plan: Herpes culture sent. Will culture also sent. Bactrim DS 1 tablet twice a day. Patient call the office if symptoms worsen or persist. Recheck a schedule, when necessary.

## 2012-05-01 ENCOUNTER — Other Ambulatory Visit: Payer: Self-pay | Admitting: Family

## 2012-05-01 LAB — HERPES SIMPLEX VIRUS CULTURE: Organism ID, Bacteria: DETECTED

## 2012-05-01 MED ORDER — VALACYCLOVIR HCL 1 G PO TABS: 1000.0000 mg | ORAL_TABLET | Freq: Three times a day (TID) | ORAL | Status: AC

## 2012-05-02 LAB — WOUND CULTURE
Gram Stain: NONE SEEN
Gram Stain: NONE SEEN
Gram Stain: NONE SEEN
Organism ID, Bacteria: NO GROWTH

## 2012-06-12 ENCOUNTER — Ambulatory Visit (INDEPENDENT_AMBULATORY_CARE_PROVIDER_SITE_OTHER): Payer: PRIVATE HEALTH INSURANCE | Admitting: Family Medicine

## 2012-06-12 ENCOUNTER — Encounter: Payer: Self-pay | Admitting: Family Medicine

## 2012-06-12 VITALS — BP 136/88 | HR 108 | Temp 100.5°F | Wt 243.0 lb

## 2012-06-12 DIAGNOSIS — J329 Chronic sinusitis, unspecified: Secondary | ICD-10-CM

## 2012-06-12 MED ORDER — AZITHROMYCIN 250 MG PO TABS: ORAL_TABLET | ORAL | Status: AC

## 2012-06-12 NOTE — Progress Notes (Signed)
  Subjective:    Patient ID: Grant Bonilla, male    DOB: Oct 10, 1960, 51 y.o.   MRN: [id number]  HPI Here for 5 days of fever, HA, sinus pressure, PND, and a dry cough.    Review of Systems  Constitutional: Positive for fever.  HENT: Positive for congestion, postnasal drip and sinus pressure.   Eyes: Negative.   Respiratory: Positive for cough.        Objective:   Physical Exam  Constitutional: He appears well-developed and well-nourished.  HENT:  Right Ear: External ear normal.  Left Ear: External ear normal.  Nose: Nose normal.  Mouth/Throat: Oropharynx is clear and moist.  Eyes: Conjunctivae are normal.  Pulmonary/Chest: Effort normal and breath sounds normal.  Lymphadenopathy:    He has no cervical adenopathy.          Assessment & Plan:  Add Mucinex and Advil

## 2012-09-28 ENCOUNTER — Other Ambulatory Visit: Payer: Self-pay | Admitting: Internal Medicine

## 2013-01-15 ENCOUNTER — Other Ambulatory Visit: Payer: Self-pay | Admitting: Internal Medicine

## 2013-01-15 DIAGNOSIS — R9431 Abnormal electrocardiogram [ECG] [EKG]: Secondary | ICD-10-CM

## 2013-01-22 ENCOUNTER — Ambulatory Visit (HOSPITAL_COMMUNITY): Payer: No Typology Code available for payment source | Attending: Internal Medicine | Admitting: Radiology

## 2013-01-22 DIAGNOSIS — R9431 Abnormal electrocardiogram [ECG] [EKG]: Secondary | ICD-10-CM | POA: Insufficient documentation

## 2013-01-22 NOTE — Progress Notes (Signed)
Echocardiogram performed.  

## 2013-04-11 ENCOUNTER — Other Ambulatory Visit: Payer: Self-pay | Admitting: Internal Medicine

## 2013-07-16 ENCOUNTER — Ambulatory Visit (INDEPENDENT_AMBULATORY_CARE_PROVIDER_SITE_OTHER): Payer: No Typology Code available for payment source | Admitting: Family

## 2013-07-16 ENCOUNTER — Encounter: Payer: Self-pay | Admitting: Family

## 2013-07-16 VITALS — BP 136/80 | HR 89 | Wt 252.0 lb

## 2013-07-16 DIAGNOSIS — I10 Essential (primary) hypertension: Secondary | ICD-10-CM

## 2013-07-16 DIAGNOSIS — A6000 Herpesviral infection of urogenital system, unspecified: Secondary | ICD-10-CM

## 2013-07-16 DIAGNOSIS — F329 Major depressive disorder, single episode, unspecified: Secondary | ICD-10-CM

## 2013-07-16 MED ORDER — VALACYCLOVIR HCL 500 MG PO TABS: 500.0000 mg | ORAL_TABLET | Freq: Two times a day (BID) | ORAL | Status: DC

## 2013-07-16 NOTE — Progress Notes (Signed)
  Subjective:    Patient ID: Grant Bonilla, male    DOB: [birth year], 52 y.o.   MRN: [id number]  HPI  52 year old white male, patient of Dr. Lovell Sheehan is in today with concerns of elevated blood pressure. He reports being seen in psychiatry and his blood pressure was 184/146. Currently on blood pressure medication tolerating it well. Does not routinely check his blood pressure outside the office. Is a history of genital herpes and is requesting a refill of Valtrex.  Review of Systems  Constitutional: Negative.   Respiratory: Negative.   Cardiovascular: Negative.   Gastrointestinal: Negative.   Endocrine: Negative.   Genitourinary: Negative.   Musculoskeletal: Negative.   Skin: Negative.   Neurological: Negative.   Hematological: Negative.   Psychiatric/Behavioral: Negative.    Past Medical History  Diagnosis Date  . Hypertension   . Anxiety   . Nail fungal infection   . Insect bite     History   Social History  . Marital Status: Married    Spouse Name: N/A    Number of Children: N/A  . Years of Education: N/A   Occupational History  . Not on file.   Social History Main Topics  . Smoking status: Never Smoker   . Smokeless tobacco: Never Used  . Alcohol Use: No  . Drug Use: No  . Sexual Activity: Yes   Other Topics Concern  . Not on file   Social History Narrative  . No narrative on file    History reviewed. No pertinent past surgical history.  History reviewed. No pertinent family history.  No Known Allergies  Current Outpatient Prescriptions on File Prior to Visit  Medication Sig Dispense Refill  . bisoprolol-hydrochlorothiazide (ZIAC) 5-6.25 MG per tablet TAKE ONE TABLET BY MOUTH ONE TIME DAILY  90 tablet  1  . pseudoephedrine (SUDAFED) 30 MG tablet Take 30 mg by mouth daily as needed.        . Vilazodone HCl (VIIBRYD) 40 MG TABS Take 1 tablet by mouth 2 (two) times daily.  60 tablet  3   No current facility-administered medications on file prior to  visit.    BP 136/80  Pulse 89  Wt 252 lb (114.306 kg)  BMI 32.34 kg/m2chart    Objective:   Physical Exam  Constitutional: He is oriented to person, place, and time. He appears well-developed and well-nourished.  HENT:  Right Ear: External ear normal.  Left Ear: External ear normal.  Nose: Nose normal.  Mouth/Throat: Oropharynx is clear and moist.  Neck: Normal range of motion. Neck supple.  Cardiovascular: Normal rate, regular rhythm and normal heart sounds.   Pulmonary/Chest: Effort normal and breath sounds normal.  Abdominal: Soft. Bowel sounds are normal.  Musculoskeletal: Normal range of motion.  Neurological: He is oriented to person, place, and time.  Skin: Skin is warm and dry.  Psychiatric: He has a normal mood and affect.          Assessment & Plan:  Assessment: 1. Hypertension 2. Genital Herpes  Plan: Continue current medications. Blood pressure appears to be stable. Valtrex minute today. Patient the office with any questions or concerns. Recheck as scheduled, and as needed.

## 2013-10-10 ENCOUNTER — Telehealth: Payer: Self-pay | Admitting: Internal Medicine
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
## 2013-10-13 ENCOUNTER — Other Ambulatory Visit: Payer: Self-pay | Admitting: *Deleted

## 2013-10-13 DIAGNOSIS — R112 Nausea with vomiting, unspecified: Secondary | ICD-10-CM

## 2013-10-13 DIAGNOSIS — R42 Dizziness and giddiness: Secondary | ICD-10-CM

## 2013-10-13 NOTE — Telephone Encounter (Signed)
Referral sent to [facility]
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
## 2013-10-22 ENCOUNTER — Other Ambulatory Visit: Payer: Self-pay | Admitting: Internal Medicine

## 2013-11-07 DIAGNOSIS — F32A Depression, unspecified: Secondary | ICD-10-CM | POA: Insufficient documentation

## 2013-11-07 DIAGNOSIS — F329 Major depressive disorder, single episode, unspecified: Secondary | ICD-10-CM | POA: Insufficient documentation

## 2013-11-19 ENCOUNTER — Ambulatory Visit (INDEPENDENT_AMBULATORY_CARE_PROVIDER_SITE_OTHER): Payer: No Typology Code available for payment source | Admitting: Neurology

## 2013-11-19 ENCOUNTER — Encounter: Payer: Self-pay | Admitting: Neurology

## 2013-11-19 VITALS — BP 114/68 | HR 68 | Temp 97.6°F | Ht 74.02 in | Wt 243.2 lb

## 2013-11-19 DIAGNOSIS — R42 Dizziness and giddiness: Secondary | ICD-10-CM

## 2013-11-19 DIAGNOSIS — R51 Headache: Secondary | ICD-10-CM

## 2013-11-19 LAB — CBC WITH DIFFERENTIAL/PLATELET
BASOS ABS: 0 10*3/uL (ref 0.0–0.1)
Basophils Relative: 0 % (ref 0–1)
EOS ABS: 0.1 10*3/uL (ref 0.0–0.7)
EOS PCT: 2 % (ref 0–5)
HEMATOCRIT: 42.6 % (ref 39.0–52.0)
Hemoglobin: 14.6 g/dL (ref 13.0–17.0)
Lymphocytes Relative: 20 % (ref 12–46)
Lymphs Abs: 1.6 10*3/uL (ref 0.7–4.0)
MCH: 28.3 pg (ref 26.0–34.0)
MCHC: 34.3 g/dL (ref 30.0–36.0)
MCV: 82.7 fL (ref 78.0–100.0)
MONO ABS: 0.6 10*3/uL (ref 0.1–1.0)
Monocytes Relative: 8 % (ref 3–12)
Neutro Abs: 5.6 10*3/uL (ref 1.7–7.7)
Neutrophils Relative %: 70 % (ref 43–77)
Platelets: 183 10*3/uL (ref 150–400)
RBC: 5.15 MIL/uL (ref 4.22–5.81)
RDW: 13.7 % (ref 11.5–15.5)
WBC: 7.9 10*3/uL (ref 4.0–10.5)

## 2013-11-19 LAB — COMPREHENSIVE METABOLIC PANEL
ALK PHOS: 49 U/L (ref 39–117)
ALT: 22 U/L (ref 0–53)
AST: 17 U/L (ref 0–37)
Albumin: 4.2 g/dL (ref 3.5–5.2)
BILIRUBIN TOTAL: 0.6 mg/dL (ref 0.2–1.2)
BUN: 14 mg/dL (ref 6–23)
CO2: 32 mEq/L (ref 19–32)
CREATININE: 1.02 mg/dL (ref 0.50–1.35)
Calcium: 9.7 mg/dL (ref 8.4–10.5)
Chloride: 102 mEq/L (ref 96–112)
GLUCOSE: 109 mg/dL — AB (ref 70–99)
Potassium: 4 mEq/L (ref 3.5–5.3)
Sodium: 140 mEq/L (ref 135–145)
Total Protein: 6.4 g/dL (ref 6.0–8.3)

## 2013-11-19 LAB — VITAMIN B12: Vitamin B-12: 340 pg/mL (ref 211–911)

## 2013-11-19 LAB — TSH: TSH: 1.049 u[IU]/mL (ref 0.350–4.500)
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
## 2013-11-19 NOTE — Patient Instructions (Addendum)
1.  MRI brain wwo contrast 2.  MRA brain with contrast 3.  Check blood work today 4.  You may use excedrin migraine for moderate to severe spells, but no more than twice per week 5.  Return to clinic in 6-weeks _________________________________________________________________________________________________________________________________ Your provider has requested that you have labwork completed today. Please go to [facility] on the first floor of this building before leaving the office today.  You have been scheduled for an MRI of the brain with and without contrast as well as an MRA of the head with contrast. Your appointment is scheduled at [facility] (1st floor) on 11/25/13 at 7:00 pm. Please arrive at 6:45 pm for registration. No preparation is needed. If you should need to reschedule, call radiology directly at [phone].  Please schedule a follow up appointment with Dr Posey Pronto in 6 weeks.

## 2013-11-24 ENCOUNTER — Other Ambulatory Visit: Payer: Self-pay | Admitting: Neurology

## 2013-11-25 ENCOUNTER — Ambulatory Visit (HOSPITAL_COMMUNITY): Payer: No Typology Code available for payment source

## 2013-11-25 ENCOUNTER — Ambulatory Visit (HOSPITAL_COMMUNITY)
Admission: RE | Admit: 2013-11-25 | Discharge: 2013-11-25 | Disposition: A | Discharge status: Home / Self Care | Payer: No Typology Code available for payment source | Source: Ambulatory Visit | Attending: Neurology | Admitting: Neurology

## 2013-11-25 DIAGNOSIS — R42 Dizziness and giddiness: Secondary | ICD-10-CM | POA: Diagnosis not present

## 2013-11-25 DIAGNOSIS — R110 Nausea: Secondary | ICD-10-CM | POA: Insufficient documentation

## 2013-11-25 DIAGNOSIS — R51 Headache: Secondary | ICD-10-CM

## 2013-11-25 LAB — GLUCOSE TOLERANCE, 2 HOURS
GLUCOSE, 2 HOUR: 190 mg/dL — AB (ref 70–139)
Glucose, Fasting: 121 mg/dL — ABNORMAL HIGH (ref 70–99)

## 2013-11-25 MED ORDER — GADOBENATE DIMEGLUMINE 529 MG/ML IV SOLN
20.0000 mL | Freq: Once | INTRAVENOUS | Status: AC | PRN
  Administered 2013-11-25: 20 mL via INTRAVENOUS

## 2013-11-26 ENCOUNTER — Telehealth: Payer: Self-pay | Admitting: Neurology

## 2013-11-26 NOTE — Telephone Encounter (Signed)
Left message on machine for patient to call back.

## 2013-11-26 NOTE — Telephone Encounter (Signed)
Message copied by Annamaria Helling on Wed Nov 26, 2013  1:18 PM ------      Message from: Narda Amber K      Created: Wed Nov 26, 2013 12:31 PM       Luvenia Starch, can you notify patient that his blood tests shows pre-diabetes/diabetes and I would like for him to follow-up with his primary care doctor?  His imaging does not show any identifiable cause for his dizziness.  There age-related changes which I can go over in clinic and no cerebellar degeneration (what his father had).  Thanks. ------

## 2013-11-27 NOTE — Telephone Encounter (Signed)
Patient made aware of lab and image results. He will keep follow up with Dr Posey Pronto to discuss and follow up with his PCP re: pre-diabetes.

## 2013-12-01 ENCOUNTER — Other Ambulatory Visit: Payer: Self-pay | Admitting: *Deleted

## 2013-12-01 ENCOUNTER — Telehealth: Payer: Self-pay | Admitting: Internal Medicine

## 2013-12-01 MED ORDER — BISOPROLOL-HYDROCHLOROTHIAZIDE 5-6.25 MG PO TABS: ORAL_TABLET | ORAL | Status: DC

## 2013-12-01 NOTE — Telephone Encounter (Signed)
sent 

## 2013-12-01 NOTE — Telephone Encounter (Signed)
Pharm called has requested 2 times for refill of  bisoprolol-hydrochlorothiazide (ZIAC) 5-6.25 MG per tablet  30 day w/ refills pls advise. [facility] [address]

## 2014-01-01 ENCOUNTER — Encounter: Payer: Self-pay | Admitting: Neurology

## 2014-01-01 ENCOUNTER — Ambulatory Visit (INDEPENDENT_AMBULATORY_CARE_PROVIDER_SITE_OTHER): Payer: No Typology Code available for payment source | Admitting: Neurology

## 2014-01-01 VITALS — BP 144/90 | HR 86 | Wt 245.4 lb

## 2014-01-01 DIAGNOSIS — R279 Unspecified lack of coordination: Secondary | ICD-10-CM

## 2014-01-01 DIAGNOSIS — R42 Dizziness and giddiness: Secondary | ICD-10-CM

## 2014-01-01 DIAGNOSIS — R270 Ataxia, unspecified: Secondary | ICD-10-CM

## 2014-01-01 DIAGNOSIS — R292 Abnormal reflex: Secondary | ICD-10-CM

## 2014-01-01 LAB — LIPID PANEL
Cholesterol: 213 mg/dL — ABNORMAL HIGH (ref 0–200)
HDL: 39 mg/dL — ABNORMAL LOW (ref >39)
LDL Cholesterol: 148 mg/dL — ABNORMAL HIGH (ref 0–99)
TRIGLYCERIDES: 131 mg/dL (ref <150)
Total CHOL/HDL Ratio: 5.5 Ratio
VLDL: 26 mg/dL (ref 0–40)

## 2014-01-01 NOTE — Patient Instructions (Signed)
1.  Check fasting lipid panel and vitamin E 2.  EMG of the right side 3.  Encouraged to stay active, use stationary bike, or water exercises 4.  Continue behavorial therapy for depression 5.  Return to clinic in 54-months
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
## 2014-01-05 LAB — VITAMIN E
Gamma-Tocopherol (Vit E): 2 mg/L (ref <=4.3)
VITAMIN E (ALPHA TOCOPHEROL): 12.3 mg/L (ref 5.7–19.9)

## 2014-02-02 ENCOUNTER — Encounter: Payer: Self-pay | Admitting: Neurology

## 2014-02-02 ENCOUNTER — Ambulatory Visit (INDEPENDENT_AMBULATORY_CARE_PROVIDER_SITE_OTHER): Payer: No Typology Code available for payment source | Admitting: Neurology

## 2014-02-02 DIAGNOSIS — G5600 Carpal tunnel syndrome, unspecified upper limb: Secondary | ICD-10-CM | POA: Insufficient documentation

## 2014-02-02 NOTE — Procedures (Signed)
[facility]  [place], [address]  [place], [place] [address] Tel: [phone] Fax:  [phone] Test Date:  02/02/2014  Patient: Grant Bonilla DOB: 1961-09-05 Physician: Narda Amber, DO  Sex: Male Height: 6\' 2"  Ref Phys: Narda Amber  ID#: [id number] Temp: 38.5C Technician:    Patient Complaints: This is a 53 year-old male with numbness and tingling and gait ataxia.  NCV & EMG Findings: Extensive electrodiagnostic evaluation of the right upper and lower extremity reveals:  1. Evaluation of the right median motor nerve showed prolonged distal onset latency (4.5 ms) with preserved amplitude. The ulnar, radial, sural, and superficial peroneal sensory responses are normal. 2. The right median sensory nerve showed prolonged distal peak latency (4.1 ms). Normal ulnar, tibial, and peroneal motor responses. 3. There is no evidence of active or chronic motor axonal loss changes affecting the tested muscles.  Impression: 1. Right median neuropathy, at or distal to the wrist, consistent with the clinical diagnosis of carpal tunnel syndrome. Overall, these findings are mild in degree electrically. 2. There is no evidence of a generalized sensorimotor polyneuropathy or lumbosacral radiculopathy affecting the right side.   ___________________________ Narda Amber, DO    Nerve Conduction Studies Anti Sensory Summary Table   Site NR Peak (ms) Norm Peak (ms) P-T Amp (V) Norm P-T Amp  Right Median Anti Sensory (2nd Digit)  38.5C  Wrist    4.1 <3.6 29.6 >15  Right Radial Anti Sensory (Base 1st Digit)  38.5C  Wrist    2.6 <2.7 34.8 >14  Right Sup Peroneal Anti Sensory (Ant Lat Mall)  38.5C  12 cm    3.3 <4.6 7.0 >4  Right Sural Anti Sensory (Lat Mall)  38.5C  Calf    3.4 <4.6 19.4 >4  Right Ulnar Anti Sensory (5th Digit)  38.5C  Wrist    3.0 <3.1 44.0 >10   Motor Summary Table   Site NR Onset (ms) Norm Onset (ms) O-P Amp (mV) Norm O-P Amp Site1 Site2 Delta-0 (ms)  Dist (cm) Vel (m/s) Norm Vel (m/s)  Right Median Motor (Abd Poll Brev)  38.5C  Wrist    4.5 <4.0 7.7 >6 Elbow Wrist 5.6 30.0 54 >50  Elbow    10.1  7.3         Right Peroneal Motor (Ext Dig Brev)  38.5C  Ankle    3.7 <6.0 7.7 >2.5 B Fib Ankle 8.1 38.0 47 >40  B Fib    11.8  6.0  Poplt B Fib 2.4 10.0 42 >40  Poplt    14.2  5.5         Right Tibial Motor (Abd Hall Brev)  38.5C  Ankle    3.4 <6.0 8.7 >4 Knee Ankle 10.1 46.0 46 >40  Knee    13.5  8.3         Right Ulnar Motor (Abd Dig Minimi)  38.5C  Wrist    2.4 <3.1 11.8 >7 B Elbow Wrist 4.7 26.5 56 >50  B Elbow    7.1  10.4  A Elbow B Elbow 2.0 10.0 50 >50  A Elbow    9.1  9.9          EMG   Side Muscle Ins Act Fibs Psw Fasc Number Recrt Dur Dur. Amp Amp. Poly Poly. Comment  Right AntTibialis Nml Nml Nml Nml Nml Nml Nml Nml Nml Nml Nml Nml N/A  Right Gastroc Nml Nml Nml Nml Nml Nml Nml Nml Nml Nml Nml Nml  N/A  Right Flex Dig Long Nml Nml Nml Nml Nml Nml Nml Nml Nml Nml Nml Nml N/A  Right RectFemoris Nml Nml Nml Nml Nml Nml Nml Nml Nml Nml Nml Nml N/A  Right BicepsFemS Nml Nml Nml Nml Nml Nml Nml Nml Nml Nml Nml Nml N/A  Right 1stDorInt Nml Nml Nml Nml Nml Nml Nml Nml Nml Nml Nml Nml N/A  Right Abd Poll Brev Nml Nml Nml Nml Nml Nml Nml Nml Nml Nml Nml Nml N/A  Right PronatorTeres Nml Nml Nml Nml Nml Nml Nml Nml Nml Nml Nml Nml N/A      Waveforms:

## 2014-02-02 NOTE — Progress Notes (Signed)
See procedure note for EMG results.  Jerene Yeager K. Mikle Sternberg, DO  

## 2014-02-16 ENCOUNTER — Encounter: Payer: Self-pay | Admitting: Neurology

## 2014-02-16 ENCOUNTER — Ambulatory Visit (INDEPENDENT_AMBULATORY_CARE_PROVIDER_SITE_OTHER): Payer: No Typology Code available for payment source | Admitting: Neurology

## 2014-02-16 VITALS — BP 128/90 | HR 70 | Ht 73.62 in | Wt 245.2 lb

## 2014-02-16 DIAGNOSIS — G2581 Restless legs syndrome: Secondary | ICD-10-CM

## 2014-02-16 DIAGNOSIS — R279 Unspecified lack of coordination: Secondary | ICD-10-CM

## 2014-02-16 DIAGNOSIS — G5600 Carpal tunnel syndrome, unspecified upper limb: Secondary | ICD-10-CM

## 2014-02-16 DIAGNOSIS — R270 Ataxia, unspecified: Secondary | ICD-10-CM

## 2014-02-16 MED ORDER — CARBIDOPA-LEVODOPA 25-100 MG PO TABS: 1.0000 | ORAL_TABLET | Freq: Every evening | ORAL | Status: DC | PRN
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
## 2014-02-16 NOTE — Patient Instructions (Addendum)
1.  Avoid hyperflexing at the wrist.   2.  Encouraged to stay active, use stationary bike, and/or start water aerobics 3.  Start sinemet 25/100mg  take one tablet at bedtime as needed for restless leg symptoms 4.  Return to clinic in 68-months or sooner as needed

## 2014-04-06 ENCOUNTER — Ambulatory Visit: Payer: No Typology Code available for payment source | Admitting: Neurology

## 2014-05-19 ENCOUNTER — Encounter: Payer: Self-pay | Admitting: Neurology

## 2014-05-19 ENCOUNTER — Ambulatory Visit (INDEPENDENT_AMBULATORY_CARE_PROVIDER_SITE_OTHER): Payer: No Typology Code available for payment source | Admitting: Neurology

## 2014-05-19 VITALS — BP 120/78 | HR 64 | Ht 74.0 in | Wt 242.2 lb

## 2014-05-19 DIAGNOSIS — G2581 Restless legs syndrome: Secondary | ICD-10-CM

## 2014-05-19 DIAGNOSIS — R279 Unspecified lack of coordination: Secondary | ICD-10-CM

## 2014-05-19 DIAGNOSIS — R270 Ataxia, unspecified: Secondary | ICD-10-CM

## 2014-05-19 DIAGNOSIS — G5600 Carpal tunnel syndrome, unspecified upper limb: Secondary | ICD-10-CM

## 2014-05-19 DIAGNOSIS — G5601 Carpal tunnel syndrome, right upper limb: Secondary | ICD-10-CM

## 2014-05-19 MED ORDER — GABAPENTIN 100 MG PO CAPS: ORAL_CAPSULE | ORAL | Status: DC
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
## 2014-05-19 NOTE — Patient Instructions (Addendum)
1.  Start using wrist splint and avoid hyperflexion of the right wrist 2.  Start taking neurontin 100mg  at bedtime x 3 days, then increase to 200mg  (2 tab) x 3 days, then start taking 3 tablets thereafter 3.  Call with an update in 3 weeks to let me know how you are tolerating the neurontin.  If doing ok at 300mg  at bedtime, I will send new prescription for 300mg  tablets. 4.  Stop taking sinemet 5.  Return to clinic in 54-months

## 2014-07-13 ENCOUNTER — Telehealth: Payer: Self-pay | Admitting: Neurology

## 2014-07-13 NOTE — Telephone Encounter (Signed)
MRI report and EMG report faxed to Brett Fairy PA-C @ [facility] at [facility] for upcoming appt. Fax# [phone] / Sherri S.

## 2014-11-19 ENCOUNTER — Ambulatory Visit: Payer: No Typology Code available for payment source | Admitting: Neurology

## 2014-12-16 ENCOUNTER — Telehealth: Payer: Self-pay | Admitting: Neurology

## 2014-12-16 NOTE — Telephone Encounter (Signed)
Pt called to cancel his f/u for 12/17/14. Pt feel fine and doesn't think that he needs to be seen.

## 2014-12-17 ENCOUNTER — Ambulatory Visit: Payer: No Typology Code available for payment source | Admitting: Neurology

## 2015-02-19 ENCOUNTER — Encounter: Payer: Self-pay | Admitting: Adult Health

## 2015-02-19 ENCOUNTER — Telehealth: Payer: Self-pay

## 2015-02-19 ENCOUNTER — Ambulatory Visit (INDEPENDENT_AMBULATORY_CARE_PROVIDER_SITE_OTHER): Payer: No Typology Code available for payment source | Admitting: Adult Health

## 2015-02-19 VITALS — BP 120/86 | Temp 98.3°F | Ht 73.5 in | Wt 235.4 lb

## 2015-02-19 DIAGNOSIS — R109 Unspecified abdominal pain: Secondary | ICD-10-CM | POA: Diagnosis not present

## 2015-02-19 DIAGNOSIS — Z23 Encounter for immunization: Secondary | ICD-10-CM

## 2015-02-19 DIAGNOSIS — Z0000 Encounter for general adult medical examination without abnormal findings: Secondary | ICD-10-CM

## 2015-02-19 LAB — POCT URINALYSIS DIPSTICK
Bilirubin, UA: NEGATIVE
Glucose, UA: NEGATIVE
KETONES UA: NEGATIVE
Leukocytes, UA: NEGATIVE
Nitrite, UA: NEGATIVE
Protein, UA: NEGATIVE
RBC UA: NEGATIVE
SPEC GRAV UA: 1.025
Urobilinogen, UA: 0.2
pH, UA: 5.5

## 2015-02-19 NOTE — Telephone Encounter (Signed)
Attempted to call to make aware that the forms pt left this morning at his visit are ready for pick up.  Also called pt to get him scheduled for a physical.  Will attempt to call pt at a later time.

## 2015-02-19 NOTE — Progress Notes (Signed)
HPI:  Grant Bonilla is here to establish care.  Last PCP and physical: Unknown, possibly "15 years"  Immunizations: UTD Diet: Vegetables, salads, fruits, lean meats, fish. Does eat fast food.  [facility]. Does sit ups and pushups at home. Walks Colonoscopy: 10 - 15 years ago. Had it done for IBS.   Dentist: Does not go to dentist. Goes to [facility] to have teeth cleaned twice a year. Eye Doctor: Yearly  Has the following chronic problems that require follow up and concerns today:  HTN - Is controlled- has not taken his blood pressure medication in six months.    BP Readings from Last 3 Encounters:  02/19/15 120/86  05/19/14 120/78  02/16/14 128/90    Depression - Is followed closely by psychiatrist, who he sees every two weeks. He does endorse that he thinks he is sleeping a lot more.   ROS negative for unless reported above: fevers, unintentional weight loss, no new hearing or vision loss, chest pain, palpitations, struggling to breath, hemoptysis, melena, hematochezia, hematuria, falls, loc, si, thoughts of self harm  Past Medical History  Diagnosis Date  . Hypertension   . Anxiety   . Nail fungal infection   . Insect bite     Past Surgical History  Procedure Laterality Date  . Nasal septum surgery      as a child  . Appendectomy      as a child    Family History  Problem Relation Age of Onset  . Hypertension Father   . Cancer Mother     History   Social History  . Marital Status: Married    Spouse Name: N/A  . Number of Children: N/A  . Years of Education: N/A   Social History Main Topics  . Smoking status: Never Smoker   . Smokeless tobacco: Never Used  . Alcohol Use: No  . Drug Use: No  . Sexual Activity: Yes   Other Topics Concern  . None   Social History Narrative   Patient is an Optometrist.    Married for 20 years    Three children (15,12,10) all live in the house   Pet Rabbit    Likes to read, watch movies,  and [facility].      Current outpatient prescriptions:  .  pseudoephedrine (SUDAFED) 30 MG tablet, Take 30 mg by mouth daily as needed.  , Disp: , Rfl:  .  valACYclovir (VALTREX) 500 MG tablet, Take by mouth., Disp: , Rfl:  .  bisoprolol-hydrochlorothiazide (ZIAC) 5-6.25 MG per tablet, TAKE ONE TABLET BY MOUTH ONE TIME DAILY (Patient not taking: Reported on 02/19/2015), Disp: 90 tablet, Rfl: 3 .  buPROPion (WELLBUTRIN XL) 150 MG 24 hr tablet, Take 1 tablet by mouth daily., Disp: , Rfl:  .  buPROPion (WELLBUTRIN XL) 300 MG 24 hr tablet, Take 1 tablet by mouth daily., Disp: , Rfl:  .  citalopram (CELEXA) 20 MG tablet, , Disp: , Rfl:  .  clonazePAM (KLONOPIN) 0.5 MG tablet, Take 1 tablet by mouth daily., Disp: , Rfl:  .  Lurasidone HCl 20 MG TABS, Take 20 mg by mouth., Disp: , Rfl:   EXAM:  Filed Vitals:   02/19/15 1011  BP: 120/86  Temp: 98.3 F (36.8 C)    Body mass index is 30.63 kg/(m^2).  GENERAL: vitals reviewed and listed above, alert, oriented, appears well hydrated and in no acute distress. Obese in abdomen.   HEENT: atraumatic, conjunttiva  clear, no obvious abnormalities on inspection of external nose and ears. He wears glasses. No cerumen impaction in bilateral ear canals.   NECK: no obvious masses on inspection. Thyroid not enlardged  LUNGS: clear to auscultation bilaterally, no wheezes, rales or rhonchi, good air movement  CV: HRRR, no peripheral edema. No carotid bruit.   MS: moves all extremities without noticeable abnormality. No edema noted  Abd: soft/nontender/nondistended/normal bowel sounds   Skin: warm and dry, no rash   Neuro: CN II-XII intact, sensation and reflexes normal throughout, 5/5 muscle strength in bilateral upper and lower extremities. Normal finger to nose. Normal rapid alternating movements.  PSYCH: pleasant and cooperative, no obvious depression or anxiety at this time.   ASSESSMENT AND PLAN:  Discussed the following assessment and  plan:  1. Right flank pain - Has history of kidney stones, feels as though he may have had one last night. No longer in pain - POC Urinalysis - Glucose/Protein - Follow up as needed  2. Health care maintenance - Continue to diet and exercise - Work on losing weight - Ambulatory referral to Gastroenterology for colonoscopy - Follow up for CPE - Follow up as needed - Continue to see psych. Go to the ER with any thoughts of suicide or self harm.    3. Need for Tdap vaccination - Tdap vaccine greater than or equal to 7yo IM  4. HTN - Is well controlled off medication - He was informed of signs and symptoms of high blood pressure and was told to notify this provider if he has any of those signs and symptoms - Monitor blood pressure at home.    -We reviewed the PMH, PSH, FH, SH, Meds and Allergies. -We provided refills for any medications we will prescribe as needed. -We addressed current concerns per orders and patient instructions.  -We have advised patient to follow up per instructions below.   -Patient advised to return or notify a provider immediately if symptoms worsen or persist or new concerns arise.  Patient Instructions  Follow up with me for a complete physical and make sure you come to the office fasting. Continue to exercise and eat a healthy diet. Work on losing weight as this with help keep your blood pressure managed and will be great for your over all health. You will be called for your colonoscopy. It was great meeting you today. Health Maintenance A healthy lifestyle and preventative care can promote health and wellness.  Maintain regular health, dental, and eye exams.  Eat a healthy diet. Foods like vegetables, fruits, whole grains, low-fat dairy products, and lean protein foods contain the nutrients you need and are low in calories. Decrease your intake of foods high in solid fats, added sugars, and salt. Get information about a proper diet from your health care  provider, if necessary.  Regular physical exercise is one of the most important things you can do for your health. Most adults should get at least 150 minutes of moderate-intensity exercise (any activity that increases your heart rate and causes you to sweat) each week. In addition, most adults need muscle-strengthening exercises on 2 or more days a week.   Maintain a healthy weight. The body mass index (BMI) is a screening tool to identify possible weight problems. It provides an estimate of body fat based on height and weight. Your health care provider can find your BMI and can help you achieve or maintain a healthy weight. For males 20 years and older:  A BMI below  18.5 is considered underweight.  A BMI of 18.5 to 24.9 is normal.  A BMI of 25 to 29.9 is considered overweight.  A BMI of 30 and above is considered obese.  Maintain normal blood lipids and cholesterol by exercising and minimizing your intake of saturated fat. Eat a balanced diet with plenty of fruits and vegetables. Blood tests for lipids and cholesterol should begin at age 72 and be repeated every 5 years. If your lipid or cholesterol levels are high, you are over age 72, or you are at high risk for heart disease, you may need your cholesterol levels checked more frequently.Ongoing high lipid and cholesterol levels should be treated with medicines if diet and exercise are not working.  If you smoke, find out from your health care provider how to quit. If you do not use tobacco, do not start.  Lung cancer screening is recommended for adults aged 79-80 years who are at high risk for developing lung cancer because of a history of smoking. A yearly low-dose CT scan of the lungs is recommended for people who have at least a 30-pack-year history of smoking and are current smokers or have quit within the past 15 years. A pack year of smoking is smoking an average of 1 pack of cigarettes a day for 1 year (for example, a 30-pack-year  history of smoking could mean smoking 1 pack a day for 30 years or 2 packs a day for 15 years). Yearly screening should continue until the smoker has stopped smoking for at least 15 years. Yearly screening should be stopped for people who develop a health problem that would prevent them from having lung cancer treatment.  If you choose to drink alcohol, do not have more than 2 drinks per day. One drink is considered to be 12 oz (360 mL) of beer, 5 oz (150 mL) of wine, or 1.5 oz (45 mL) of liquor.  Avoid the use of street drugs. Do not share needles with anyone. Ask for help if you need support or instructions about stopping the use of drugs.  High blood pressure causes heart disease and increases the risk of stroke. Blood pressure should be checked at least every 1-2 years. Ongoing high blood pressure should be treated with medicines if weight loss and exercise are not effective.  If you are 8-28 years old, ask your health care provider if you should take aspirin to prevent heart disease.  Diabetes screening involves taking a blood sample to check your fasting blood sugar level. This should be done once every 3 years after age 23 if you are at a normal weight and without risk factors for diabetes. Testing should be considered at a younger age or be carried out more frequently if you are overweight and have at least 1 risk factor for diabetes.  Colorectal cancer can be detected and often prevented. Most routine colorectal cancer screening begins at the age of 4 and continues through age 54. However, your health care provider may recommend screening at an earlier age if you have risk factors for colon cancer. On a yearly basis, your health care provider may provide home test kits to check for hidden blood in the stool. A small camera at the end of a tube may be used to directly examine the colon (sigmoidoscopy or colonoscopy) to detect the earliest forms of colorectal cancer. Talk to your health care  provider about this at age 50 when routine screening begins. A direct exam of the colon should  be repeated every 5-10 years through age 9, unless early forms of precancerous polyps or small growths are found.  People who are at an increased risk for hepatitis B should be screened for this virus. You are considered at high risk for hepatitis B if:  You were born in a country where hepatitis B occurs often. Talk with your health care provider about which countries are considered high risk.  Your parents were born in a high-risk country and you have not received a shot to protect against hepatitis B (hepatitis B vaccine).  You have HIV or AIDS.  You use needles to inject street drugs.  You live with, or have sex with, someone who has hepatitis B.  You are a man who has sex with other men (MSM).  You get hemodialysis treatment.  You take certain medicines for conditions like cancer, organ transplantation, and autoimmune conditions.  Hepatitis C blood testing is recommended for all people born from [birth year] through 1965 and any individual with known risk factors for hepatitis C.  Healthy men should no longer receive prostate-specific antigen (PSA) blood tests as part of routine cancer screening. Talk to your health care provider about prostate cancer screening.  Testicular cancer screening is not recommended for adolescents or adult males who have no symptoms. Screening includes self-exam, a health care provider exam, and other screening tests. Consult with your health care provider about any symptoms you have or any concerns you have about testicular cancer.  Practice safe sex. Use condoms and avoid high-risk sexual practices to reduce the spread of sexually transmitted infections (STIs).  You should be screened for STIs, including gonorrhea and chlamydia if:  You are sexually active and are younger than 24 years.  You are older than 24 years, and your health care provider tells you that you  are at risk for this type of infection.  Your sexual activity has changed since you were last screened, and you are at an increased risk for chlamydia or gonorrhea. Ask your health care provider if you are at risk.  If you are at risk of being infected with HIV, it is recommended that you take a prescription medicine daily to prevent HIV infection. This is called pre-exposure prophylaxis (PrEP). You are considered at risk if:  You are a man who has sex with other men (MSM).  You are a heterosexual man who is sexually active with multiple partners.  You take drugs by injection.  You are sexually active with a partner who has HIV.  Talk with your health care provider about whether you are at high risk of being infected with HIV. If you choose to begin PrEP, you should first be tested for HIV. You should then be tested every 3 months for as long as you are taking PrEP.  Use sunscreen. Apply sunscreen liberally and repeatedly throughout the day. You should seek shade when your shadow is shorter than you. Protect yourself by wearing long sleeves, pants, a wide-brimmed hat, and sunglasses year round whenever you are outdoors.  Tell your health care provider of new moles or changes in moles, especially if there is a change in shape or color. Also, tell your health care provider if a mole is larger than the size of a pencil eraser.  A one-time screening for abdominal aortic aneurysm (AAA) and surgical repair of large AAAs by ultrasound is recommended for men aged 39-75 years who are current or former smokers.  Stay current with your vaccines (immunizations). Document Released:  03/23/2008 Document Revised: 09/30/2013 Document Reviewed: 02/20/2011 [employer] Patient Information 2015 [place], [place]. This information is not intended to replace advice given to you by your health care provider. Make sure you discuss any questions you have with your health care provider.      Dorothyann Peng,  AGNP

## 2015-02-19 NOTE — Progress Notes (Signed)
Pre visit review using our clinic review tool, if applicable. No additional management support is needed unless otherwise documented below in the visit note. 

## 2015-02-19 NOTE — Addendum Note (Signed)
Addended by: Elmer Picker on: 02/19/2015 12:19 PM   Modules accepted: Orders

## 2015-02-19 NOTE — Patient Instructions (Addendum)
Follow up with me for a complete physical and make sure you come to the office fasting. Continue to exercise and eat a healthy diet. Work on losing weight as this with help keep your blood pressure managed and will be great for your over all health. You will be called for your colonoscopy. It was great meeting you today. Health Maintenance A healthy lifestyle and preventative care can promote health and wellness.  Maintain regular health, dental, and eye exams.  Eat a healthy diet. Foods like vegetables, fruits, whole grains, low-fat dairy products, and lean protein foods contain the nutrients you need and are low in calories. Decrease your intake of foods high in solid fats, added sugars, and salt. Get information about a proper diet from your health care provider, if necessary.  Regular physical exercise is one of the most important things you can do for your health. Most adults should get at least 150 minutes of moderate-intensity exercise (any activity that increases your heart rate and causes you to sweat) each week. In addition, most adults need muscle-strengthening exercises on 2 or more days a week.   Maintain a healthy weight. The body mass index (BMI) is a screening tool to identify possible weight problems. It provides an estimate of body fat based on height and weight. Your health care provider can find your BMI and can help you achieve or maintain a healthy weight. For males 20 years and older:  A BMI below 18.5 is considered underweight.  A BMI of 18.5 to 24.9 is normal.  A BMI of 25 to 29.9 is considered overweight.  A BMI of 30 and above is considered obese.  Maintain normal blood lipids and cholesterol by exercising and minimizing your intake of saturated fat. Eat a balanced diet with plenty of fruits and vegetables. Blood tests for lipids and cholesterol should begin at age 78 and be repeated every 5 years. If your lipid or cholesterol levels are high, you are over age 65, or  you are at high risk for heart disease, you may need your cholesterol levels checked more frequently.Ongoing high lipid and cholesterol levels should be treated with medicines if diet and exercise are not working.  If you smoke, find out from your health care provider how to quit. If you do not use tobacco, do not start.  Lung cancer screening is recommended for adults aged 18-80 years who are at high risk for developing lung cancer because of a history of smoking. A yearly low-dose CT scan of the lungs is recommended for people who have at least a 30-pack-year history of smoking and are current smokers or have quit within the past 15 years. A pack year of smoking is smoking an average of 1 pack of cigarettes a day for 1 year (for example, a 30-pack-year history of smoking could mean smoking 1 pack a day for 30 years or 2 packs a day for 15 years). Yearly screening should continue until the smoker has stopped smoking for at least 15 years. Yearly screening should be stopped for people who develop a health problem that would prevent them from having lung cancer treatment.  If you choose to drink alcohol, do not have more than 2 drinks per day. One drink is considered to be 12 oz (360 mL) of beer, 5 oz (150 mL) of wine, or 1.5 oz (45 mL) of liquor.  Avoid the use of street drugs. Do not share needles with anyone. Ask for help if you need support or  instructions about stopping the use of drugs.  High blood pressure causes heart disease and increases the risk of stroke. Blood pressure should be checked at least every 1-2 years. Ongoing high blood pressure should be treated with medicines if weight loss and exercise are not effective.  If you are 69-72 years old, ask your health care provider if you should take aspirin to prevent heart disease.  Diabetes screening involves taking a blood sample to check your fasting blood sugar level. This should be done once every 3 years after age 15 if you are at a  normal weight and without risk factors for diabetes. Testing should be considered at a younger age or be carried out more frequently if you are overweight and have at least 1 risk factor for diabetes.  Colorectal cancer can be detected and often prevented. Most routine colorectal cancer screening begins at the age of 67 and continues through age 24. However, your health care provider may recommend screening at an earlier age if you have risk factors for colon cancer. On a yearly basis, your health care provider may provide home test kits to check for hidden blood in the stool. A small camera at the end of a tube may be used to directly examine the colon (sigmoidoscopy or colonoscopy) to detect the earliest forms of colorectal cancer. Talk to your health care provider about this at age 78 when routine screening begins. A direct exam of the colon should be repeated every 5-10 years through age 72, unless early forms of precancerous polyps or small growths are found.  People who are at an increased risk for hepatitis B should be screened for this virus. You are considered at high risk for hepatitis B if:  You were born in a country where hepatitis B occurs often. Talk with your health care provider about which countries are considered high risk.  Your parents were born in a high-risk country and you have not received a shot to protect against hepatitis B (hepatitis B vaccine).  You have HIV or AIDS.  You use needles to inject street drugs.  You live with, or have sex with, someone who has hepatitis B.  You are a man who has sex with other men (MSM).  You get hemodialysis treatment.  You take certain medicines for conditions like cancer, organ transplantation, and autoimmune conditions.  Hepatitis C blood testing is recommended for all people born from [birth year] through 1963 and any individual with known risk factors for hepatitis C.  Healthy men should no longer receive prostate-specific antigen  (PSA) blood tests as part of routine cancer screening. Talk to your health care provider about prostate cancer screening.  Testicular cancer screening is not recommended for adolescents or adult males who have no symptoms. Screening includes self-exam, a health care provider exam, and other screening tests. Consult with your health care provider about any symptoms you have or any concerns you have about testicular cancer.  Practice safe sex. Use condoms and avoid high-risk sexual practices to reduce the spread of sexually transmitted infections (STIs).  You should be screened for STIs, including gonorrhea and chlamydia if:  You are sexually active and are younger than 24 years.  You are older than 24 years, and your health care provider tells you that you are at risk for this type of infection.  Your sexual activity has changed since you were last screened, and you are at an increased risk for chlamydia or gonorrhea. Ask your health care provider if  you are at risk.  If you are at risk of being infected with HIV, it is recommended that you take a prescription medicine daily to prevent HIV infection. This is called pre-exposure prophylaxis (PrEP). You are considered at risk if:  You are a man who has sex with other men (MSM).  You are a heterosexual man who is sexually active with multiple partners.  You take drugs by injection.  You are sexually active with a partner who has HIV.  Talk with your health care provider about whether you are at high risk of being infected with HIV. If you choose to begin PrEP, you should first be tested for HIV. You should then be tested every 3 months for as long as you are taking PrEP.  Use sunscreen. Apply sunscreen liberally and repeatedly throughout the day. You should seek shade when your shadow is shorter than you. Protect yourself by wearing long sleeves, pants, a wide-brimmed hat, and sunglasses year round whenever you are outdoors.  Tell your health  care provider of new moles or changes in moles, especially if there is a change in shape or color. Also, tell your health care provider if a mole is larger than the size of a pencil eraser.  A one-time screening for abdominal aortic aneurysm (AAA) and surgical repair of large AAAs by ultrasound is recommended for men aged 63-75 years who are current or former smokers.  Stay current with your vaccines (immunizations). Document Released: 03/23/2008 Document Revised: 09/30/2013 Document Reviewed: 02/20/2011 [facility] Patient Information 2015 [place], [place]. This information is not intended to replace advice given to you by your health care provider. Make sure you discuss any questions you have with your health care provider.

## 2015-02-23 ENCOUNTER — Encounter: Payer: Self-pay | Admitting: Internal Medicine

## 2015-02-23 NOTE — Telephone Encounter (Signed)
Attempted to call pt; vm full and cannot accept message. Will attempt to call at a later time.

## 2015-02-24 NOTE — Telephone Encounter (Signed)
Pt called office and Estill Bamberg is going to fax paperwork and fax confirmation.  Pt will then come pick up paperwork and there will be a note that pt needs to make a physical appt.

## 2015-03-29 ENCOUNTER — Ambulatory Visit (AMBULATORY_SURGERY_CENTER): Payer: Self-pay | Admitting: *Deleted

## 2015-03-29 VITALS — Ht 73.5 in | Wt 237.0 lb

## 2015-03-29 DIAGNOSIS — Z1211 Encounter for screening for malignant neoplasm of colon: Secondary | ICD-10-CM

## 2015-03-29 MED ORDER — NA SULFATE-K SULFATE-MG SULF 17.5-3.13-1.6 GM/177ML PO SOLN: 1.0000 | ORAL | Status: DC

## 2015-03-29 NOTE — Progress Notes (Signed)
Patient denies any allergies to eggs or soy. Patient denies any problems with anesthesia/sedation. Patient denies any oxygen use at home and does not take any diet/weight loss medications. EMMI education assisgned to patient on colonoscopy, this was explained and instructions given to patient. 

## 2015-04-07 ENCOUNTER — Telehealth: Payer: Self-pay | Admitting: Internal Medicine

## 2015-04-07 NOTE — Telephone Encounter (Signed)
suprep sample left at 4th floor desk with pt's name on it  Grant Bonilla/previsit

## 2015-04-13 ENCOUNTER — Ambulatory Visit (AMBULATORY_SURGERY_CENTER): Payer: No Typology Code available for payment source | Admitting: Internal Medicine

## 2015-04-13 ENCOUNTER — Encounter: Payer: Self-pay | Admitting: Internal Medicine

## 2015-04-13 VITALS — BP 139/88 | HR 67 | Temp 98.7°F | Resp 16 | Ht 73.5 in | Wt 237.0 lb

## 2015-04-13 DIAGNOSIS — D125 Benign neoplasm of sigmoid colon: Secondary | ICD-10-CM | POA: Diagnosis not present

## 2015-04-13 DIAGNOSIS — Z1211 Encounter for screening for malignant neoplasm of colon: Secondary | ICD-10-CM

## 2015-04-13 DIAGNOSIS — D120 Benign neoplasm of cecum: Secondary | ICD-10-CM

## 2015-04-13 DIAGNOSIS — D124 Benign neoplasm of descending colon: Secondary | ICD-10-CM

## 2015-04-13 MED ORDER — SODIUM CHLORIDE 0.9 % IV SOLN: 500.0000 mL | INTRAVENOUS | Status: DC

## 2015-04-13 NOTE — Progress Notes (Signed)
Called to room to assist during endoscopic procedure.  Patient ID and intended procedure confirmed with present staff. Received instructions for my participation in the procedure from the performing physician.  

## 2015-04-13 NOTE — Progress Notes (Signed)
To recovery, report to Myers, RN, VSS. 

## 2015-04-13 NOTE — Patient Instructions (Signed)
YOU HAD AN ENDOSCOPIC PROCEDURE TODAY AT [facility]:   Refer to the procedure report that was given to you for any specific questions about what was found during the examination.  If the procedure report does not answer your questions, please call your gastroenterologist to clarify.  If you requested that your care partner not be given the details of your procedure findings, then the procedure report has been included in a sealed envelope for you to review at your convenience later.  YOU SHOULD EXPECT: Some feelings of bloating in the abdomen. Passage of more gas than usual.  Walking can help get rid of the air that was put into your GI tract during the procedure and reduce the bloating. If you had a lower endoscopy (such as a colonoscopy or flexible sigmoidoscopy) you may notice spotting of blood in your stool or on the toilet paper. If you underwent a bowel prep for your procedure, you may not have a normal bowel movement for a few days.  Please Note:  You might notice some irritation and congestion in your nose or some drainage.  This is from the oxygen used during your procedure.  There is no need for concern and it should clear up in a day or so.  SYMPTOMS TO REPORT IMMEDIATELY:   Following lower endoscopy (colonoscopy or flexible sigmoidoscopy):  Excessive amounts of blood in the stool  Significant tenderness or worsening of abdominal pains  Swelling of the abdomen that is new, acute  Fever of 100F or higher  For urgent or emergent issues, a gastroenterologist can be reached at any hour by calling [phone].   DIET: Your first meal following the procedure should be a small meal and then it is ok to progress to your normal diet. Heavy or fried foods are harder to digest and may make you feel nauseous or bloated.  Likewise, meals heavy in dairy and vegetables can increase bloating.  Drink plenty of fluids but you should avoid alcoholic beverages for 24  hours.  ACTIVITY:  You should plan to take it easy for the rest of today and you should NOT DRIVE or use heavy machinery until tomorrow (because of the sedation medicines used during the test).    FOLLOW UP: Our staff will call the number listed on your records the next business day following your procedure to check on you and address any questions or concerns that you may have regarding the information given to you following your procedure. If we do not reach you, we will leave a message.  However, if you are feeling well and you are not experiencing any problems, there is no need to return our call.  We will assume that you have returned to your regular daily activities without incident.  If any biopsies were taken you will be contacted by phone or by letter within the next 1-3 weeks.  Please call us at [phone] if you have not heard about the biopsies in 3 weeks.    SIGNATURES/CONFIDENTIALITY: You and/or your care partner have signed paperwork which will be entered into your electronic medical record.  These signatures attest to the fact that that the information above on your After Visit Summary has been reviewed and is understood.  Full responsibility of the confidentiality of this discharge information lies with you and/or your care-partner.  Avoid all NSAID's for 2 weeks(aspirin, aspirin products, and anti inflammatory drugs). Next colonoscopy determined by pathology results; 3 or 10 years. Please review polyp,  diverticulosis, and high fiber diet handouts provided.

## 2015-04-13 NOTE — Op Note (Signed)
[place]  [address]. [place], [id number]   COLONOSCOPY PROCEDURE REPORT  PATIENT: Grant Bonilla, Grant Bonilla  MR#: [id number] BIRTHDATE: July 21, 1961 , 23  yrs. old GENDER: male ENDOSCOPIST: Jerene Bears, MD REFERRED BY: Dorothyann Peng, NP PROCEDURE DATE:  04/13/2015 PROCEDURE:   Colonoscopy, screening, Colonoscopy with snare polypectomy, and Colonoscopy with cold biopsy polypectomy First Screening Colonoscopy - Avg.  risk and is 50 yrs.  old or older Yes.  Prior Negative Screening - Now for repeat screening. N/A  History of Adenoma - Now for follow-up colonoscopy & has been > or = to 3 yrs.  N/A  Polyps removed today? Yes ASA CLASS:   Class II INDICATIONS:Screening for colonic neoplasia and Colorectal Neoplasm Risk Assessment for this procedure is average risk. MEDICATIONS: Monitored anesthesia care and Propofol 300 mg IV  DESCRIPTION OF PROCEDURE:   After the risks benefits and alternatives of the procedure were thoroughly explained, informed consent was obtained.  The digital rectal exam revealed no rectal mass.   The LB [id number]  endoscope was introduced through the anus and advanced to the cecum, which was identified by both the appendix and ileocecal valve. No adverse events experienced. The quality of the prep was good.  (Suprep was used)  The instrument was then slowly withdrawn as the colon was fully examined. Estimated blood loss is zero unless otherwise noted in this procedure report.  COLON FINDINGS: A semi-pedunculated polyp measuring 10 mm in size was found at the cecum.  A polypectomy was performed using snare cautery.  The resection was complete, the polyp tissue was completely retrieved and sent to histology.   Three sessile polyps ranging from 3 to 61mm in size were found in the descending colon (1) and sigmoid colon (3).  Polypectomies were performed with a cold snare (2) and with cold forceps 91).  The resection was complete, the polyp tissue  was completely retrieved and sent to histology.   There was moderate diverticulosis noted in the descending colon and sigmoid colon.  Retroflexed views revealed internal hemorrhoids. The time to cecum = 2.5 Withdrawal time = 15.5   The scope was withdrawn and the procedure completed. COMPLICATIONS: There were no immediate complications.  ENDOSCOPIC IMPRESSION: 1.   Semi-pedunculated polyp was found at the cecum; polypectomy was performed using snare cautery 2.   Three sessile polyps ranging from 3 to 72mm in size were found in the descending colon and sigmoid colon; polypectomies were performed with a cold snare and with cold forceps 3.   Moderate diverticulosis was noted in the descending colon and sigmoid colon  RECOMMENDATIONS: 1.  Avoid all NSAIDS for the next 2 weeks. 2.  Await pathology results 3.  High fiber diet 4.  If the polyps removed today are proven to be adenomatous (pre-cancerous) polyps, you will need a colonoscopy in 3 years. Otherwise you should continue to follow colorectal cancer screening guidelines for "routine risk" patients with a colonoscopy in 10 years.  You will receive a letter within 1-2 weeks with the results of your biopsy as well as final recommendations.  Please call my office if you have not received a letter after 3 weeks.  eSigned:  Jerene Bears, MD 04/13/2015 11:54 AM   cc:  the patient, PCP   PATIENT NAME:  Grant Bonilla, Grant Bonilla MR#: [id number]

## 2015-04-14 ENCOUNTER — Telehealth: Payer: Self-pay

## 2015-04-14 NOTE — Telephone Encounter (Signed)
Left a message on the pt's wife, Lisa's cell phone.  Check on her husband, if he has any questions or concerns to please call us back . maw

## 2015-04-20 ENCOUNTER — Encounter: Payer: Self-pay | Admitting: Internal Medicine

## 2015-09-10 ENCOUNTER — Telehealth: Payer: Self-pay | Admitting: Adult Health

## 2015-09-10 ENCOUNTER — Other Ambulatory Visit: Payer: Self-pay | Admitting: Adult Health

## 2015-09-10 MED ORDER — VALACYCLOVIR HCL 500 MG PO TABS: 500.0000 mg | ORAL_TABLET | Freq: Three times a day (TID) | ORAL | Status: DC

## 2015-09-10 NOTE — Telephone Encounter (Signed)
Pls advise.  

## 2015-09-10 NOTE — Telephone Encounter (Signed)
Pt needs new rx valtrex pills  call into [facility] in [place]

## 2016-04-21 ENCOUNTER — Ambulatory Visit (INDEPENDENT_AMBULATORY_CARE_PROVIDER_SITE_OTHER): Payer: BLUE CROSS/BLUE SHIELD | Admitting: Adult Health

## 2016-04-21 ENCOUNTER — Encounter: Payer: Self-pay | Admitting: Adult Health

## 2016-04-21 VITALS — BP 198/104 | Temp 97.8°F | Ht 73.5 in | Wt 232.0 lb

## 2016-04-21 DIAGNOSIS — I10 Essential (primary) hypertension: Secondary | ICD-10-CM | POA: Diagnosis not present

## 2016-04-21 LAB — BASIC METABOLIC PANEL
BUN: 15 mg/dL (ref 6–23)
CALCIUM: 9.6 mg/dL (ref 8.4–10.5)
CO2: 30 meq/L (ref 19–32)
CREATININE: 1.07 mg/dL (ref 0.40–1.50)
Chloride: 103 mEq/L (ref 96–112)
GFR: 76.72 mL/min (ref >60.00)
Glucose, Bld: 116 mg/dL — ABNORMAL HIGH (ref 70–99)
Potassium: 4.4 mEq/L (ref 3.5–5.1)
Sodium: 140 mEq/L (ref 135–145)

## 2016-04-21 MED ORDER — BISOPROLOL-HYDROCHLOROTHIAZIDE 10-6.25 MG PO TABS: 1.0000 | ORAL_TABLET | Freq: Every day | ORAL | Status: DC

## 2016-04-21 NOTE — Patient Instructions (Signed)
It was great seeing you today  I have sent in a prescription for Ziac. Take this when you get it and then every morning.   If your Blood pressure continues to be above 170/90, then take another dose.   Follow up in one week for recheck   Make an appointment for a physical this fall.

## 2016-04-21 NOTE — Progress Notes (Signed)
Subjective:    Patient ID: Grant Bonilla, male    DOB: [birth year], 55 y.o.   MRN: [id number]  HPI  55 year old male who presents to the office today for blood pressure check. He went to the dentist yesterday for his teeth cleaning and his blood pressure was elevated at 165/110. Today his blood pressure was 198/104, on second measurement is was 170/90  Back in May 2016 he had been off his blood pressure medication for about 6 months and his blood pressure had been normal... 120/80's. We decided to take him off this medication.   He denies any blurred vision or headaches  He has recently started Concerta   BP Readings from Last 3 Encounters:  04/21/16 198/104  04/13/15 139/88  02/19/15 120/86      Review of Systems  Constitutional: Negative.   Respiratory: Negative.   Cardiovascular: Negative.   Neurological: Negative.   All other systems reviewed and are negative.  Past Medical History  Diagnosis Date  . Hypertension   . Anxiety   . Nail fungal infection   . Insect bite   . Fuchs' corneal dystrophy     Social History   Social History  . Marital Status: Married    Spouse Name: N/A  . Number of Children: N/A  . Years of Education: N/A   Occupational History  . Not on file.   Social History Main Topics  . Smoking status: Never Smoker   . Smokeless tobacco: Never Used  . Alcohol Use: No  . Drug Use: No  . Sexual Activity: Yes   Other Topics Concern  . Not on file   Social History Narrative   Patient is an Optometrist.    Married for 20 years    Three children (15,12,10) all live in the house   Pet Rabbit    Likes to read, watch movies, and [place] the [place]     Past Surgical History  Procedure Laterality Date  . Nasal septum surgery      as a child  . Tonsillectomy    . Lithotripsy      Family History  Problem Relation Age of Onset  . Hypertension Father   . Cancer Mother   . Colon cancer Neg Hx   . Esophageal cancer Neg Hx   . Rectal cancer  Neg Hx   . Stomach cancer Neg Hx     No Known Allergies  Current Outpatient Prescriptions on File Prior to Visit  Medication Sig Dispense Refill  . buPROPion (WELLBUTRIN XL) 300 MG 24 hr tablet Take 1 tablet by mouth daily.    . citalopram (CELEXA) 20 MG tablet     . clonazePAM (KLONOPIN) 0.5 MG tablet Take 1 tablet by mouth daily.    . Lurasidone HCl 20 MG TABS Take 20 mg by mouth.    . Multiple Vitamin (MULTIVITAMIN) tablet Take 1 tablet by mouth daily.    . pseudoephedrine (SUDAFED) 30 MG tablet Take 30 mg by mouth daily as needed.      . valACYclovir (VALTREX) 500 MG tablet Take 1 tablet (500 mg total) by mouth 3 (three) times daily. 30 tablet 3   No current facility-administered medications on file prior to visit.    BP 198/104 mmHg  Temp(Src) 97.8 F (36.6 C) (Oral)  Ht 6' 1.5" (1.867 m)  Wt 232 lb (105.235 kg)  BMI 30.19 kg/m2       Objective:   Physical Exam  Constitutional: He is  oriented to person, place, and time. He appears well-developed and well-nourished. No distress.  Eyes: Conjunctivae and EOM are normal. Pupils are equal, round, and reactive to light. Right eye exhibits no discharge. Left eye exhibits no discharge.  Cardiovascular: Normal rate, regular rhythm, normal heart sounds and intact distal pulses.  Exam reveals no gallop and no friction rub.   No murmur heard. Pulmonary/Chest: Effort normal and breath sounds normal. No respiratory distress. He has no wheezes. He has no rales. He exhibits no tenderness.  Neurological: He is alert and oriented to person, place, and time.  Skin: Skin is warm and dry. No rash noted. He is not diaphoretic. No erythema. No pallor.  Psychiatric: He has a normal mood and affect. His behavior is normal. Judgment and thought content normal.  Nursing note and vitals reviewed.     Assessment & Plan:  1. Essential hypertension - He is going to go to the pharmacy after leaving here and pick up his medication. He will take one  pill when he gets it.  - bisoprolol-hydrochlorothiazide (ZIAC) 10-6.25 MG tablet; Take 1 tablet by mouth daily.  Dispense: 30 tablet; Refill: 3 - Monitor BP at home and bring log  - Follow up in one week or sooner if needed - Basic metabolic panel  Dorothyann Peng, NP

## 2016-04-27 ENCOUNTER — Encounter: Payer: Self-pay | Admitting: Adult Health

## 2016-04-27 ENCOUNTER — Ambulatory Visit (INDEPENDENT_AMBULATORY_CARE_PROVIDER_SITE_OTHER): Payer: BLUE CROSS/BLUE SHIELD | Admitting: Adult Health

## 2016-04-27 VITALS — BP 148/70 | Temp 98.3°F | Ht 73.5 in | Wt 233.6 lb

## 2016-04-27 DIAGNOSIS — I10 Essential (primary) hypertension: Secondary | ICD-10-CM

## 2016-04-27 NOTE — Progress Notes (Signed)
Subjective:    Patient ID: Grant Bonilla, male    DOB: [birth year], 55 y.o.   MRN: [id number]  HPI  55 year old male who presents to the office today for one week follow up regarding his blood pressure. He had been off his blood pressure medication for about 6 months. When he presented to the dentist his blood pressure was 170/90.   He was started on Ziac 10-6.25, which was his dose previously and he was within goal while.    Today in the office he reports feeling " better." Has not had any dizziness or lightheadedness.   He is not monitoring at home    Review of Systems  Constitutional: Negative.   Respiratory: Negative.   Cardiovascular: Negative.   Gastrointestinal: Negative.   Neurological: Negative.   All other systems reviewed and are negative.  Past Medical History  Diagnosis Date  . Hypertension   . Anxiety   . Nail fungal infection   . Insect bite   . Fuchs' corneal dystrophy     Social History   Social History  . Marital Status: Married    Spouse Name: N/A  . Number of Children: N/A  . Years of Education: N/A   Occupational History  . Not on file.   Social History Main Topics  . Smoking status: Never Smoker   . Smokeless tobacco: Never Used  . Alcohol Use: No  . Drug Use: No  . Sexual Activity: Yes   Other Topics Concern  . Not on file   Social History Narrative   Patient is an Optometrist.    Married for 20 years    Three children (15,12,10) all live in the house   Pet Rabbit    Likes to read, watch movies, and go to the Y.     Past Surgical History  Procedure Laterality Date  . Nasal septum surgery      as a child  . Tonsillectomy    . Lithotripsy      Family History  Problem Relation Age of Onset  . Hypertension Father   . Cancer Mother   . Colon cancer Neg Hx   . Esophageal cancer Neg Hx   . Rectal cancer Neg Hx   . Stomach cancer Neg Hx     No Known Allergies  Current Outpatient Prescriptions on File Prior to Visit    Medication Sig Dispense Refill  . bisoprolol-hydrochlorothiazide (ZIAC) 10-6.25 MG tablet Take 1 tablet by mouth daily. 30 tablet 3  . buPROPion (WELLBUTRIN XL) 300 MG 24 hr tablet Take 1 tablet by mouth daily.    . citalopram (CELEXA) 20 MG tablet     . clonazePAM (KLONOPIN) 0.5 MG tablet Take 1 tablet by mouth daily.    . Lurasidone HCl 20 MG TABS Take 20 mg by mouth.    . Multiple Vitamin (MULTIVITAMIN) tablet Take 1 tablet by mouth daily.    . pseudoephedrine (SUDAFED) 30 MG tablet Take 30 mg by mouth daily as needed.      . valACYclovir (VALTREX) 500 MG tablet Take 1 tablet (500 mg total) by mouth 3 (three) times daily. 30 tablet 3   No current facility-administered medications on file prior to visit.    BP 148/70 mmHg  Temp(Src) 98.3 F (36.8 C) (Oral)  Ht 6' 1.5" (1.867 m)  Wt 233 lb 9.6 oz (105.96 kg)  BMI 30.40 kg/m2       Objective:   Physical Exam  Constitutional: He is  oriented to person, place, and time. He appears well-developed and well-nourished. No distress.  Cardiovascular: Normal rate, regular rhythm, normal heart sounds and intact distal pulses.  Exam reveals no gallop and no friction rub.   No murmur heard. Pulmonary/Chest: Effort normal and breath sounds normal. No respiratory distress. He has no wheezes. He has no rales. He exhibits no tenderness.  Neurological: He is alert and oriented to person, place, and time.  Skin: Skin is warm and dry. No rash noted. He is not diaphoretic. No erythema. No pallor.  Psychiatric: He has a normal mood and affect. His behavior is normal. Judgment and thought content normal.  Nursing note and vitals reviewed.     Assessment & Plan:  1. Essential hypertension - 148/70 today - Continue with same dose.  - Follow up in one month  - Monitor BP at home and bring log.   Dorothyann Peng, NP

## 2016-04-27 NOTE — Patient Instructions (Addendum)
It was great seeing you again .   Your blood pressure is coming down nicely.   Bring log to next appointment.   Follow up in one month   Hypertension Hypertension, commonly called high blood pressure, is when the force of blood pumping through your arteries is too strong. Your arteries are the blood vessels that carry blood from your heart throughout your body. A blood pressure reading consists of a higher number over a lower number, such as 110/72. The higher number (systolic) is the pressure inside your arteries when your heart pumps. The lower number (diastolic) is the pressure inside your arteries when your heart relaxes. Ideally you want your blood pressure below 120/80. Hypertension forces your heart to work harder to pump blood. Your arteries may become narrow or stiff. Having untreated or uncontrolled hypertension can cause heart attack, stroke, kidney disease, and other problems. RISK FACTORS Some risk factors for high blood pressure are controllable. Others are not.  Risk factors you cannot control include:   Race. You may be at higher risk if you are African American.  Age. Risk increases with age.  Gender. Men are at higher risk than women before age 39 years. After age 38, women are at higher risk than men. Risk factors you can control include:  Not getting enough exercise or physical activity.  Being overweight.  Getting too much fat, sugar, calories, or salt in your diet.  Drinking too much alcohol. SIGNS AND SYMPTOMS Hypertension does not usually cause signs or symptoms. Extremely high blood pressure (hypertensive crisis) may cause headache, anxiety, shortness of breath, and nosebleed. DIAGNOSIS To check if you have hypertension, your health care provider will measure your blood pressure while you are seated, with your arm held at the level of your heart. It should be measured at least twice using the same arm. Certain conditions can cause a difference in blood  pressure between your right and left arms. A blood pressure reading that is higher than normal on one occasion does not mean that you need treatment. If it is not clear whether you have high blood pressure, you may be asked to return on a different day to have your blood pressure checked again. Or, you may be asked to monitor your blood pressure at home for 1 or more weeks. TREATMENT Treating high blood pressure includes making lifestyle changes and possibly taking medicine. Living a healthy lifestyle can help lower high blood pressure. You may need to change some of your habits. Lifestyle changes may include:  Following the DASH diet. This diet is high in fruits, vegetables, and whole grains. It is low in salt, red meat, and added sugars.  Keep your sodium intake below 2,300 mg per day.  Getting at least 30-45 minutes of aerobic exercise at least 4 times per week.  Losing weight if necessary.  Not smoking.  Limiting alcoholic beverages.  Learning ways to reduce stress. Your health care provider may prescribe medicine if lifestyle changes are not enough to get your blood pressure under control, and if one of the following is true:  You are 27-38 years of age and your systolic blood pressure is above 140.  You are 76 years of age or older, and your systolic blood pressure is above 150.  Your diastolic blood pressure is above 90.  You have diabetes, and your systolic blood pressure is over [id number] or your diastolic blood pressure is over 90.  You have kidney disease and your blood pressure is above 140/90.  You have heart disease and your blood pressure is above 140/90. Your personal target blood pressure may vary depending on your medical conditions, your age, and other factors. HOME CARE INSTRUCTIONS  Have your blood pressure rechecked as directed by your health care provider.   Take medicines only as directed by your health care provider. Follow the directions carefully. Blood  pressure medicines must be taken as prescribed. The medicine does not work as well when you skip doses. Skipping doses also puts you at risk for problems.  Do not smoke.   Monitor your blood pressure at home as directed by your health care provider. SEEK MEDICAL CARE IF:   You think you are having a reaction to medicines taken.  You have recurrent headaches or feel dizzy.  You have swelling in your ankles.  You have trouble with your vision. SEEK IMMEDIATE MEDICAL CARE IF:  You develop a severe headache or confusion.  You have unusual weakness, numbness, or feel faint.  You have severe chest or abdominal pain.  You vomit repeatedly.  You have trouble breathing. MAKE SURE YOU:   Understand these instructions.  Will watch your condition.  Will get help right away if you are not doing well or get worse.   This information is not intended to replace advice given to you by your health care provider. Make sure you discuss any questions you have with your health care provider.   Document Released: 09/25/2005 Document Revised: 02/09/2015 Document Reviewed: 07/18/2013 Elsevier Interactive Patient Education [employer].

## 2016-05-25 ENCOUNTER — Ambulatory Visit: Payer: BLUE CROSS/BLUE SHIELD | Admitting: Adult Health

## 2016-07-27 ENCOUNTER — Encounter: Payer: BLUE CROSS/BLUE SHIELD | Admitting: Adult Health

## 2016-07-27 ENCOUNTER — Ambulatory Visit (INDEPENDENT_AMBULATORY_CARE_PROVIDER_SITE_OTHER): Payer: BLUE CROSS/BLUE SHIELD | Admitting: Adult Health

## 2016-07-27 VITALS — BP 138/76 | HR 64 | Temp 98.0°F | Ht 73.5 in | Wt 237.0 lb

## 2016-07-27 DIAGNOSIS — R202 Paresthesia of skin: Secondary | ICD-10-CM | POA: Diagnosis not present

## 2016-07-27 DIAGNOSIS — H9313 Tinnitus, bilateral: Secondary | ICD-10-CM

## 2016-07-27 DIAGNOSIS — R200 Anesthesia of skin: Secondary | ICD-10-CM | POA: Diagnosis not present

## 2016-07-27 LAB — BASIC METABOLIC PANEL
BUN: 19 mg/dL (ref 6–23)
CO2: 33 mEq/L — ABNORMAL HIGH (ref 19–32)
Calcium: 9.8 mg/dL (ref 8.4–10.5)
Chloride: 102 mEq/L (ref 96–112)
Creatinine, Ser: 1.09 mg/dL (ref 0.40–1.50)
GFR: 75.02 mL/min (ref >60.00)
GLUCOSE: 83 mg/dL (ref 70–99)
POTASSIUM: 4.3 meq/L (ref 3.5–5.1)
Sodium: 140 mEq/L (ref 135–145)

## 2016-07-27 LAB — SEDIMENTATION RATE: SED RATE: 19 mm/h (ref 0–20)

## 2016-07-27 LAB — VITAMIN D 25 HYDROXY (VIT D DEFICIENCY, FRACTURES): VITD: 21.1 ng/mL — ABNORMAL LOW (ref 30.00–100.00)

## 2016-07-27 LAB — CBC WITH DIFFERENTIAL/PLATELET
BASOS ABS: 0 10*3/uL (ref 0.0–0.1)
BASOS PCT: 0.4 % (ref 0.0–3.0)
EOS PCT: 1.5 % (ref 0.0–5.0)
Eosinophils Absolute: 0.1 10*3/uL (ref 0.0–0.7)
HEMATOCRIT: 45.1 % (ref 39.0–52.0)
Hemoglobin: 15.3 g/dL (ref 13.0–17.0)
LYMPHS ABS: 1.9 10*3/uL (ref 0.7–4.0)
LYMPHS PCT: 25.7 % (ref 12.0–46.0)
MCHC: 33.9 g/dL (ref 30.0–36.0)
MCV: 84.2 fl (ref 78.0–100.0)
Monocytes Absolute: 0.7 10*3/uL (ref 0.1–1.0)
Monocytes Relative: 9.5 % (ref 3.0–12.0)
NEUTROS PCT: 62.9 % (ref 43.0–77.0)
Neutro Abs: 4.5 10*3/uL (ref 1.4–7.7)
PLATELETS: 188 10*3/uL (ref 150.0–400.0)
RBC: 5.36 Mil/uL (ref 4.22–5.81)
RDW: 13 % (ref 11.5–15.5)
WBC: 7.2 10*3/uL (ref 4.0–10.5)

## 2016-07-27 LAB — VITAMIN B12: Vitamin B-12: 223 pg/mL (ref 211–911)

## 2016-07-27 LAB — C-REACTIVE PROTEIN: CRP: 0.6 mg/dL (ref 0.5–20.0)

## 2016-07-27 NOTE — Progress Notes (Deleted)
   Subjective:    Patient ID: Grant Bonilla, male    DOB: August 25, 1961, 55 y.o.   MRN: [id number]  HPI  Patient presents for yearly preventative medicine examination. He is a pleasant 55 year old male who  has a past medical history of Anxiety; Fuchs' corneal dystrophy; Hypertension; Insect bite; and Nail fungal infection.  All immunizations and health maintenance protocols were reviewed with the patient and needed orders were placed.  Appropriate screening laboratory values were ordered for the patient including screening of hyperlipidemia, renal function and hepatic function. If indicated by BPH, a PSA was ordered.  Medication reconciliation,  past medical history, social history, problem list and allergies were reviewed in detail with the patient  Goals were established with regard to weight loss, exercise, and  diet in compliance with medications  End of life planning was discussed.   Review of Systems  Constitutional: Negative.   Eyes: Negative.   Respiratory: Negative.   Cardiovascular: Negative.   Gastrointestinal: Negative.   Musculoskeletal: Negative.   Skin: Negative.   Allergic/Immunologic: Negative.   Neurological: Negative.   Hematological: Negative.   Psychiatric/Behavioral: Negative.   All other systems reviewed and are negative.      Objective:   Physical Exam  Constitutional: He is oriented to person, place, and time. He appears well-developed and well-nourished. No distress.  HENT:  Head: Normocephalic and atraumatic.  Right Ear: External ear normal.  Left Ear: External ear normal.  Nose: Nose normal.  Mouth/Throat: Oropharynx is clear and moist. No oropharyngeal exudate.  Eyes: Conjunctivae and EOM are normal. Pupils are equal, round, and reactive to light. Right eye exhibits no discharge. Left eye exhibits no discharge. No scleral icterus.  Neck: Normal range of motion. Neck supple. No JVD present. No tracheal deviation present. No thyromegaly  present.  Cardiovascular: Normal rate, regular rhythm, normal heart sounds and intact distal pulses.  Exam reveals no gallop and no friction rub.   No murmur heard. Pulmonary/Chest: Effort normal and breath sounds normal. No stridor. No respiratory distress. He has no wheezes. He has no rales. He exhibits no tenderness.  Abdominal: Soft. Bowel sounds are normal. He exhibits no distension and no mass. There is no tenderness. There is no rebound and no guarding.  Musculoskeletal: Normal range of motion.  Lymphadenopathy:    He has no cervical adenopathy.  Neurological: He is alert and oriented to person, place, and time. He has normal reflexes. He displays normal reflexes. No cranial nerve deficit. He exhibits normal muscle tone. Coordination normal.  Skin: Skin is warm and dry. No rash noted. He is not diaphoretic. No erythema. No pallor.  Psychiatric: He has a normal mood and affect. His behavior is normal. Judgment and thought content normal.  Nursing note and vitals reviewed.         Assessment & Plan:

## 2016-07-27 NOTE — Progress Notes (Signed)
Subjective:    Patient ID: Grant Bonilla, male    DOB: 1961-07-20, 55 y.o.   MRN: [id number]  HPI  55 year old male who presents to the office today with the complaint of numbness and tingling in bilateral upper extremities R>L. This has been an ongoing issue for the last " few years" but has been becoming worse over the last three months. The numbness and tingling has been constant. The numbness is just in has hands. He denies any radiating pain or loss of sensation completley. At times he feels as though his grip strength has decreased and at times grip items helps with the discomfort.   He denies CP, SOB, blurred vision, slurred speech, or facial droop.   He also complains of tinnitus in bilateral ears that has been " going on for awhile." He reports that the ringing is not constant and is more pronounced when he is in a quiet room.    Review of Systems  Constitutional: Negative.   HENT: Positive for tinnitus. Negative for ear discharge, ear pain and hearing loss.   Respiratory: Negative.   Cardiovascular: Negative.   Gastrointestinal: Negative.   Musculoskeletal: Negative.   Neurological: Positive for numbness.  Psychiatric/Behavioral: Negative.   All other systems reviewed and are negative.  Past Medical History:  Diagnosis Date  . Anxiety   . Fuchs' corneal dystrophy   . Hypertension   . Insect bite   . Nail fungal infection     Social History   Social History  . Marital status: Married    Spouse name: N/A  . Number of children: N/A  . Years of education: N/A   Occupational History  . Not on file.   Social History Main Topics  . Smoking status: Never Smoker  . Smokeless tobacco: Never Used  . Alcohol use No  . Drug use: No  . Sexual activity: Yes   Other Topics Concern  . Not on file   Social History Narrative   Patient is an Optometrist.    Married for 20 years    Three children (15,12,10) all live in the house   Pet Rabbit    Likes to read, watch  movies, and [facility] [employer]     Past Surgical History:  Procedure Laterality Date  . LITHOTRIPSY    . NASAL SEPTUM SURGERY     as a child  . TONSILLECTOMY      Family History  Problem Relation Age of Onset  . Hypertension Father   . Cancer Mother   . Colon cancer Neg Hx   . Esophageal cancer Neg Hx   . Rectal cancer Neg Hx   . Stomach cancer Neg Hx     No Known Allergies  Current Outpatient Prescriptions on File Prior to Visit  Medication Sig Dispense Refill  . bisoprolol-hydrochlorothiazide (ZIAC) 10-6.25 MG tablet Take 1 tablet by mouth daily. 30 tablet 3  . buPROPion (WELLBUTRIN XL) 300 MG 24 hr tablet Take 1 tablet by mouth daily.    . citalopram (CELEXA) 20 MG tablet     . clonazePAM (KLONOPIN) 0.5 MG tablet Take 1 tablet by mouth daily.    . Lurasidone HCl 20 MG TABS Take 20 mg by mouth.    . Multiple Vitamin (MULTIVITAMIN) tablet Take 1 tablet by mouth daily.    . pseudoephedrine (SUDAFED) 30 MG tablet Take 30 mg by mouth daily as needed.      . valACYclovir (VALTREX) 500 MG tablet Take  1 tablet (500 mg total) by mouth 3 (three) times daily. 30 tablet 3   No current facility-administered medications on file prior to visit.     BP 138/76   Pulse 64   Temp 98 F (36.7 C)   Ht 6' 1.5" (1.867 m)   Wt 237 lb (107.5 kg)   BMI 30.84 kg/m       Objective:   Physical Exam  Constitutional: He is oriented to person, place, and time. He appears well-developed and well-nourished. No distress.  HENT:  Head: Normocephalic and atraumatic.  Right Ear: External ear normal.  Left Ear: External ear normal.  Nose: Nose normal.  Mouth/Throat: Oropharynx is clear and moist. No oropharyngeal exudate.  Eyes: Conjunctivae and EOM are normal. Pupils are equal, round, and reactive to light. Right eye exhibits no discharge. No scleral icterus.  Neck: Normal range of motion. Neck supple. No JVD present. No tracheal deviation present. No thyromegaly present.  Cardiovascular: Normal  rate, regular rhythm, normal heart sounds and intact distal pulses.  Exam reveals no gallop and no friction rub.   No murmur heard. Pulmonary/Chest: Effort normal and breath sounds normal. No stridor. No respiratory distress. He has no wheezes. He has no rales. He exhibits no tenderness.  Musculoskeletal: Normal range of motion. He exhibits no edema, tenderness or deformity.  No loss of grip strength or motor strength in upper extremities  Lymphadenopathy:    He has no cervical adenopathy.  Neurological: He is alert and oriented to person, place, and time. He has normal strength and normal reflexes. He displays normal reflexes. No cranial nerve deficit or sensory deficit. He exhibits normal muscle tone. Coordination normal.  Skin: Skin is warm and dry. No rash noted. He is not diaphoretic. No erythema. No pallor.  Psychiatric: He has a normal mood and affect. His behavior is normal. Judgment and thought content normal.  Nursing note and vitals reviewed.      Assessment & Plan:  1. Numbness and tingling in both hands - Basic metabolic panel; Future - CBC with Differential/Platelet - ANA - C-reactive Protein - Sedimentation Rate - Vitamin B12 - Vitamin D, 99991111 - Basic metabolic panel - MR Cervical Spine Wo Contrast; Future  2. Tinnitus of both ears - Ambulatory referral to Audiology  Dorothyann Peng, NP

## 2016-07-27 NOTE — Patient Instructions (Signed)
It was great seeing you today   I will follow up with you regarding your labs  Someone will call you to schedule your MRI and hearing exam

## 2016-07-28 LAB — ANA: ANA: NEGATIVE

## 2016-08-13 ENCOUNTER — Other Ambulatory Visit: Payer: Self-pay | Admitting: Adult Health

## 2016-08-13 DIAGNOSIS — I10 Essential (primary) hypertension: Secondary | ICD-10-CM

## 2016-08-14 ENCOUNTER — Ambulatory Visit
Admission: RE | Admit: 2016-08-14 | Discharge: 2016-08-14 | Disposition: A | Discharge status: Home / Self Care | Payer: BLUE CROSS/BLUE SHIELD | Source: Ambulatory Visit | Attending: Adult Health | Admitting: Adult Health

## 2016-08-14 DIAGNOSIS — R202 Paresthesia of skin: Principal | ICD-10-CM

## 2016-08-14 DIAGNOSIS — R200 Anesthesia of skin: Secondary | ICD-10-CM

## 2016-08-18 ENCOUNTER — Other Ambulatory Visit: Payer: Self-pay

## 2016-08-18 MED ORDER — VALACYCLOVIR HCL 500 MG PO TABS: 500.0000 mg | ORAL_TABLET | Freq: Three times a day (TID) | ORAL | 3 refills | Status: AC

## 2016-08-29 ENCOUNTER — Ambulatory Visit (INDEPENDENT_AMBULATORY_CARE_PROVIDER_SITE_OTHER): Payer: BLUE CROSS/BLUE SHIELD | Admitting: Adult Health

## 2016-08-29 ENCOUNTER — Encounter: Payer: Self-pay | Admitting: Adult Health

## 2016-08-29 VITALS — BP 132/70 | Temp 98.6°F | Ht 73.5 in | Wt 237.9 lb

## 2016-08-29 DIAGNOSIS — F411 Generalized anxiety disorder: Secondary | ICD-10-CM

## 2016-08-29 DIAGNOSIS — Z0000 Encounter for general adult medical examination without abnormal findings: Secondary | ICD-10-CM

## 2016-08-29 DIAGNOSIS — I10 Essential (primary) hypertension: Secondary | ICD-10-CM | POA: Diagnosis not present

## 2016-08-29 LAB — POC URINALSYSI DIPSTICK (AUTOMATED)
Bilirubin, UA: NEGATIVE
Blood, UA: NEGATIVE
Glucose, UA: NEGATIVE
Ketones, UA: NEGATIVE
Leukocytes, UA: NEGATIVE
Nitrite, UA: NEGATIVE
PH UA: 5.5
PROTEIN UA: NEGATIVE
UROBILINOGEN UA: 0.2

## 2016-08-29 NOTE — Progress Notes (Signed)
Subjective:    Patient ID: Grant Bonilla, male    DOB: 1961-05-01, 55 y.o.   MRN: [id number]  HPI  Patient presents for yearly preventative medicine examination. He is a pleasant 55 year old male who  has a past medical history of Anxiety; Fuchs' corneal dystrophy; Hypertension; Insect bite; and Nail fungal infection.  All immunizations and health maintenance protocols were reviewed with the patient and needed orders were placed.  Appropriate screening laboratory values were ordered for the patient including screening of hyperlipidemia, renal function and hepatic function. If indicated by BPH, a PSA was ordered.  Medication reconciliation,  past medical history, social history, problem list and allergies were reviewed in detail with the patient  Goals were established with regard to weight loss, exercise, and  diet in compliance with medications. He is " lightly exercising" which includes sit up and stretching exercises. He eats out a lot and does not follow a healthy lifestyle.   End of life planning was discussed.  He follows up for anxiety and ADHD and feels as though this is not very well controlled currently. He states " I always feel anxious"   He continues to have numbness and tingling in his upper extremities from time to time.   Review of Systems  Constitutional: Negative.   HENT: Negative.   Eyes: Negative.   Respiratory: Negative.   Cardiovascular: Negative.   Gastrointestinal: Negative.   Endocrine: Negative.   Genitourinary: Negative.   Musculoskeletal: Negative.   Skin: Negative.   Allergic/Immunologic: Negative.   Neurological: Negative.   Hematological: Negative.   Psychiatric/Behavioral: Negative.   All other systems reviewed and are negative.      Objective:   Physical Exam  Constitutional: He is oriented to person, place, and time. He appears well-developed and well-nourished. No distress.  obese  HENT:  Head: Normocephalic and atraumatic.    Right Ear: External ear normal.  Left Ear: External ear normal.  Nose: Nose normal.  Mouth/Throat: Oropharynx is clear and moist. No oropharyngeal exudate.  Eyes: Conjunctivae and EOM are normal. Pupils are equal, round, and reactive to light. Right eye exhibits no discharge. Left eye exhibits no discharge. No scleral icterus.  Neck: Normal range of motion. Neck supple. No JVD present. Carotid bruit is not present. No tracheal deviation present. No thyroid mass and no thyromegaly present.  Cardiovascular: Normal rate, regular rhythm, normal heart sounds and intact distal pulses.  Exam reveals no gallop and no friction rub.   No murmur heard. Pulmonary/Chest: Effort normal and breath sounds normal. No stridor. No respiratory distress. He has no wheezes. He has no rales. He exhibits no tenderness.  Abdominal: Soft. Normal appearance, normal aorta and bowel sounds are normal. He exhibits no distension and no mass. There is no hepatosplenomegaly, splenomegaly or hepatomegaly. There is generalized tenderness. There is no rebound, no guarding and no CVA tenderness. No hernia.  Genitourinary: Rectum normal and prostate normal. Rectal exam shows guaiac negative stool.  Musculoskeletal: Normal range of motion. He exhibits no edema, tenderness or deformity.  Lymphadenopathy:    He has no cervical adenopathy.  Neurological: He is alert and oriented to person, place, and time. He has normal reflexes. He displays normal reflexes. No cranial nerve deficit. Coordination normal.  Skin: Skin is warm and dry. No rash noted. He is not diaphoretic. No erythema. No pallor.  Psychiatric: He has a normal mood and affect. His behavior is normal. Judgment and thought content normal.  Nursing note and vitals reviewed.  Assessment & Plan:  1. Routine general medical examination at a health care facility - He needs to work on eating a heart healthy diet and engaging in aerobic exercise.  - Follow up in 4-6 months.  I would like him to be less than 220 pounds by the next visit.  - Follow up in 1 year for next CPE - Basic metabolic panel - CBC with Differential/Platelet - Hemoglobin A1c - Hepatic function panel - Lipid panel - POCT Urinalysis Dipstick (Automated) - PSA - TSH  2. Essential (primary) hypertension - Has improved. Near goal.  - No change in medication. Will continue to monitor.  - Basic metabolic panel - CBC with Differential/Platelet - Hemoglobin A1c - Hepatic function panel - Lipid panel - POCT Urinalysis Dipstick (Automated) - PSA - TSH  3. Anxiety state - Continue with current plan. Follow up with psychiatry as needed - I believe that if he got into an exercise routine his anxiety and depression would be better controlled  Dorothyann Peng, NP

## 2016-08-29 NOTE — Patient Instructions (Signed)
It was great seeing you again today!  I will follow up with you regarding your labs.   Please start exercising and eating healthier- this will improve your overall health  Follow up with me in 4-6 months- I would like you down to atleast 220 pounds by that time  Health Maintenance, Male A healthy lifestyle and preventative care can promote health and wellness.  Maintain regular health, dental, and eye exams.  Eat a healthy diet. Foods like vegetables, fruits, whole grains, low-fat dairy products, and lean protein foods contain the nutrients you need and are low in calories. Decrease your intake of foods high in solid fats, added sugars, and salt. Get information about a proper diet from your health care provider, if necessary.  Regular physical exercise is one of the most important things you can do for your health. Most adults should get at least 150 minutes of moderate-intensity exercise (any activity that increases your heart rate and causes you to sweat) each week. In addition, most adults need muscle-strengthening exercises on 2 or more days a week.   Maintain a healthy weight. The body mass index (BMI) is a screening tool to identify possible weight problems. It provides an estimate of body fat based on height and weight. Your health care provider can find your BMI and can help you achieve or maintain a healthy weight. For males 20 years and older:  A BMI below 18.5 is considered underweight.  A BMI of 18.5 to 24.9 is normal.  A BMI of 25 to 29.9 is considered overweight.  A BMI of 30 and above is considered obese.  Maintain normal blood lipids and cholesterol by exercising and minimizing your intake of saturated fat. Eat a balanced diet with plenty of fruits and vegetables. Blood tests for lipids and cholesterol should begin at age 5 and be repeated every 5 years. If your lipid or cholesterol levels are high, you are over age 36, or you are at high risk for heart disease, you may  need your cholesterol levels checked more frequently.Ongoing high lipid and cholesterol levels should be treated with medicines if diet and exercise are not working.  If you smoke, find out from your health care provider how to quit. If you do not use tobacco, do not start.  Lung cancer screening is recommended for adults aged 22-80 years who are at high risk for developing lung cancer because of a history of smoking. A yearly low-dose CT scan of the lungs is recommended for people who have at least a 30-pack-year history of smoking and are current smokers or have quit within the past 15 years. A pack year of smoking is smoking an average of 1 pack of cigarettes a day for 1 year (for example, a 30-pack-year history of smoking could mean smoking 1 pack a day for 30 years or 2 packs a day for 15 years). Yearly screening should continue until the smoker has stopped smoking for at least 15 years. Yearly screening should be stopped for people who develop a health problem that would prevent them from having lung cancer treatment.  If you choose to drink alcohol, do not have more than 2 drinks per day. One drink is considered to be 12 oz (360 mL) of beer, 5 oz (150 mL) of wine, or 1.5 oz (45 mL) of liquor.  Avoid the use of street drugs. Do not share needles with anyone. Ask for help if you need support or instructions about stopping the use of drugs.  High blood pressure causes heart disease and increases the risk of stroke. High blood pressure is more likely to develop in:  People who have blood pressure in the end of the normal range (100-139/85-89 mm Hg).  People who are overweight or obese.  People who are African American.  If you are 54-41 years of age, have your blood pressure checked every 3-5 years. If you are 4 years of age or older, have your blood pressure checked every year. You should have your blood pressure measured twice--once when you are at a hospital or clinic, and once when you are  not at a hospital or clinic. Record the average of the two measurements. To check your blood pressure when you are not at a hospital or clinic, you can use:  An automated blood pressure machine at a pharmacy.  A home blood pressure monitor.  If you are 35-49 years old, ask your health care provider if you should take aspirin to prevent heart disease.  Diabetes screening involves taking a blood sample to check your fasting blood sugar level. This should be done once every 3 years after age 51 if you are at a normal weight and without risk factors for diabetes. Testing should be considered at a younger age or be carried out more frequently if you are overweight and have at least 1 risk factor for diabetes.  Colorectal cancer can be detected and often prevented. Most routine colorectal cancer screening begins at the age of 41 and continues through age 35. However, your health care provider may recommend screening at an earlier age if you have risk factors for colon cancer. On a yearly basis, your health care provider may provide home test kits to check for hidden blood in the stool. A small camera at the end of a tube may be used to directly examine the colon (sigmoidoscopy or colonoscopy) to detect the earliest forms of colorectal cancer. Talk to your health care provider about this at age 41 when routine screening begins. A direct exam of the colon should be repeated every 5-10 years through age 16, unless early forms of precancerous polyps or small growths are found.  People who are at an increased risk for hepatitis B should be screened for this virus. You are considered at high risk for hepatitis B if:  You were born in a country where hepatitis B occurs often. Talk with your health care provider about which countries are considered high risk.  Your parents were born in a high-risk country and you have not received a shot to protect against hepatitis B (hepatitis B vaccine).  You have HIV or  AIDS.  You use needles to inject street drugs.  You live with, or have sex with, someone who has hepatitis B.  You are a man who has sex with other men (MSM).  You get hemodialysis treatment.  You take certain medicines for conditions like cancer, organ transplantation, and autoimmune conditions.  Hepatitis C blood testing is recommended for all people born from [birth year] through 1963 and any individual with known risk factors for hepatitis C.  Healthy men should no longer receive prostate-specific antigen (PSA) blood tests as part of routine cancer screening. Talk to your health care provider about prostate cancer screening.  Testicular cancer screening is not recommended for adolescents or adult males who have no symptoms. Screening includes self-exam, a health care provider exam, and other screening tests. Consult with your health care provider about any symptoms you have or any  concerns you have about testicular cancer.  Practice safe sex. Use condoms and avoid high-risk sexual practices to reduce the spread of sexually transmitted infections (STIs).  You should be screened for STIs, including gonorrhea and chlamydia if:  You are sexually active and are younger than 24 years.  You are older than 24 years, and your health care provider tells you that you are at risk for this type of infection.  Your sexual activity has changed since you were last screened, and you are at an increased risk for chlamydia or gonorrhea. Ask your health care provider if you are at risk.  If you are at risk of being infected with HIV, it is recommended that you take a prescription medicine daily to prevent HIV infection. This is called pre-exposure prophylaxis (PrEP). You are considered at risk if:  You are a man who has sex with other men (MSM).  You are a heterosexual man who is sexually active with multiple partners.  You take drugs by injection.  You are sexually active with a partner who has  HIV.  Talk with your health care provider about whether you are at high risk of being infected with HIV. If you choose to begin PrEP, you should first be tested for HIV. You should then be tested every 3 months for as long as you are taking PrEP.  Use sunscreen. Apply sunscreen liberally and repeatedly throughout the day. You should seek shade when your shadow is shorter than you. Protect yourself by wearing long sleeves, pants, a wide-brimmed hat, and sunglasses year round whenever you are outdoors.  Tell your health care provider of new moles or changes in moles, especially if there is a change in shape or color. Also, tell your health care provider if a mole is larger than the size of a pencil eraser.  A one-time screening for abdominal aortic aneurysm (AAA) and surgical repair of large AAAs by ultrasound is recommended for men aged 61-75 years who are current or former smokers.  Stay current with your vaccines (immunizations).   This information is not intended to replace advice given to you by your health care provider. Make sure you discuss any questions you have with your health care provider.   Document Released: 03/23/2008 Document Revised: 10/16/2014 Document Reviewed: 02/20/2011 Elsevier Interactive Patient Education [employer].

## 2016-08-30 ENCOUNTER — Other Ambulatory Visit: Payer: Self-pay | Admitting: Adult Health

## 2016-08-30 LAB — HEPATIC FUNCTION PANEL
ALBUMIN: 4.6 g/dL (ref 3.5–5.2)
ALK PHOS: 57 U/L (ref 39–117)
ALT: 18 U/L (ref 0–53)
AST: 16 U/L (ref 0–37)
Bilirubin, Direct: 0.1 mg/dL (ref 0.0–0.3)
TOTAL PROTEIN: 7 g/dL (ref 6.0–8.3)
Total Bilirubin: 0.6 mg/dL (ref 0.2–1.2)

## 2016-08-30 LAB — CBC WITH DIFFERENTIAL/PLATELET
Basophils Absolute: 0 10*3/uL (ref 0.0–0.1)
Basophils Relative: 0.4 % (ref 0.0–3.0)
EOS PCT: 1.3 % (ref 0.0–5.0)
Eosinophils Absolute: 0.1 10*3/uL (ref 0.0–0.7)
HCT: 46.3 % (ref 39.0–52.0)
HEMOGLOBIN: 15.6 g/dL (ref 13.0–17.0)
Lymphocytes Relative: 22.9 % (ref 12.0–46.0)
Lymphs Abs: 2.2 10*3/uL (ref 0.7–4.0)
MCHC: 33.7 g/dL (ref 30.0–36.0)
MCV: 84.2 fl (ref 78.0–100.0)
MONOS PCT: 7 % (ref 3.0–12.0)
Monocytes Absolute: 0.7 10*3/uL (ref 0.1–1.0)
Neutro Abs: 6.5 10*3/uL (ref 1.4–7.7)
Neutrophils Relative %: 68.4 % (ref 43.0–77.0)
Platelets: 209 10*3/uL (ref 150.0–400.0)
RBC: 5.49 Mil/uL (ref 4.22–5.81)
RDW: 12.8 % (ref 11.5–15.5)
WBC: 9.5 10*3/uL (ref 4.0–10.5)

## 2016-08-30 LAB — LIPID PANEL
Cholesterol: 234 mg/dL — ABNORMAL HIGH (ref 0–200)
HDL: 40.8 mg/dL (ref >39.00)
NonHDL: 193.62
Total CHOL/HDL Ratio: 6
Triglycerides: 224 mg/dL — ABNORMAL HIGH (ref 0.0–149.0)
VLDL: 44.8 mg/dL — AB (ref 0.0–40.0)

## 2016-08-30 LAB — BASIC METABOLIC PANEL
BUN: 16 mg/dL (ref 6–23)
CO2: 31 mEq/L (ref 19–32)
Calcium: 9.9 mg/dL (ref 8.4–10.5)
Chloride: 101 mEq/L (ref 96–112)
Creatinine, Ser: 1.22 mg/dL (ref 0.40–1.50)
GFR: 65.85 mL/min (ref >60.00)
Glucose, Bld: 92 mg/dL (ref 70–99)
POTASSIUM: 4.3 meq/L (ref 3.5–5.1)
SODIUM: 141 meq/L (ref 135–145)

## 2016-08-30 LAB — PSA: PSA: 0.91 ng/mL (ref 0.10–4.00)

## 2016-08-30 LAB — LDL CHOLESTEROL, DIRECT: Direct LDL: 162 mg/dL

## 2016-08-30 LAB — TSH: TSH: 1.52 u[IU]/mL (ref 0.35–4.50)

## 2016-08-30 LAB — HEMOGLOBIN A1C: HEMOGLOBIN A1C: 5.9 % (ref 4.6–6.5)

## 2016-08-30 MED ORDER — ATORVASTATIN CALCIUM 20 MG PO TABS: 20.0000 mg | ORAL_TABLET | Freq: Every day | ORAL | 3 refills | Status: AC

## 2016-09-06 ENCOUNTER — Other Ambulatory Visit: Payer: Self-pay | Admitting: Adult Health

## 2016-09-06 DIAGNOSIS — I10 Essential (primary) hypertension: Secondary | ICD-10-CM

## 2016-09-25 ENCOUNTER — Other Ambulatory Visit: Payer: Self-pay | Admitting: Otolaryngology

## 2016-09-25 DIAGNOSIS — E041 Nontoxic single thyroid nodule: Secondary | ICD-10-CM

## 2016-09-26 ENCOUNTER — Encounter: Payer: Self-pay | Admitting: Neurology

## 2016-09-26 ENCOUNTER — Ambulatory Visit (INDEPENDENT_AMBULATORY_CARE_PROVIDER_SITE_OTHER): Payer: BLUE CROSS/BLUE SHIELD | Admitting: Neurology

## 2016-09-26 VITALS — BP 120/80 | HR 68 | Ht 73.5 in | Wt 240.2 lb

## 2016-09-26 DIAGNOSIS — R202 Paresthesia of skin: Secondary | ICD-10-CM

## 2016-09-26 DIAGNOSIS — M79602 Pain in left arm: Secondary | ICD-10-CM

## 2016-09-26 DIAGNOSIS — G5601 Carpal tunnel syndrome, right upper limb: Secondary | ICD-10-CM
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
## 2016-09-26 NOTE — Patient Instructions (Addendum)
1.  NCS/EMG of the arms 2.  NCS/EMG of the legs 3.  Start using a wrist splint  We will call you with the results

## 2016-10-04 ENCOUNTER — Other Ambulatory Visit: Payer: Self-pay | Admitting: Adult Health

## 2016-10-04 DIAGNOSIS — I10 Essential (primary) hypertension: Secondary | ICD-10-CM

## 2016-10-05 ENCOUNTER — Ambulatory Visit (INDEPENDENT_AMBULATORY_CARE_PROVIDER_SITE_OTHER): Payer: BLUE CROSS/BLUE SHIELD | Admitting: Neurology

## 2016-10-05 ENCOUNTER — Telehealth: Payer: Self-pay | Admitting: Neurology

## 2016-10-05 ENCOUNTER — Ambulatory Visit
Admission: RE | Admit: 2016-10-05 | Discharge: 2016-10-05 | Disposition: A | Discharge status: Home / Self Care | Payer: BLUE CROSS/BLUE SHIELD | Source: Ambulatory Visit | Attending: Otolaryngology | Admitting: Otolaryngology

## 2016-10-05 DIAGNOSIS — E041 Nontoxic single thyroid nodule: Secondary | ICD-10-CM

## 2016-10-05 DIAGNOSIS — G5603 Carpal tunnel syndrome, bilateral upper limbs: Secondary | ICD-10-CM

## 2016-10-05 DIAGNOSIS — R202 Paresthesia of skin: Secondary | ICD-10-CM

## 2016-10-05 DIAGNOSIS — G5601 Carpal tunnel syndrome, right upper limb: Secondary | ICD-10-CM | POA: Diagnosis not present

## 2016-10-05 DIAGNOSIS — M79602 Pain in left arm: Secondary | ICD-10-CM

## 2016-10-05 NOTE — Procedures (Signed)
[facility]  [place], [place]  [place], [place] [address] Tel: [phone] Fax:  [phone] Test Date:  10/05/2016  Patient: Grant Bonilla DOB: 1961/03/25 Physician: Narda Amber, DO  Sex: Male Height: 6\' 2"  Ref Phys: Narda Amber, DO  ID#: [id number] Temp: 33.4C Technician: Jerilynn Mages. Dean   Patient Complaints: This is a 55 year old gentleman referred for evaluation of bilateral hand numbness and tingling.   NCV & EMG Findings: Extensive electrodiagnostic testing of the right upper extremity and additional studies of the left shows:  1. Right median sensory response shows prolonged latency (3.8 ms) and normal amplitude. Left mixed palmar sensory response shows prolonged latency. Left median and bilateral ulnar sensory responses are within normal limits. 2. Right median motor response shows prolonged latency (4.1 ms) and normal amplitude. Bilateral ulnar and left median motor responses are within normal limits. 3. Sparse chronic motor axon loss changes are seen affecting the right abductor pollicis brevis muscle, without accompanied active denervation. These findings are not present on the left upper extremity.  Impression: 1. Right median neuropathy at or distal to the wrist, consistent with the clinical diagnosis of carpal tunnel syndrome; moderate in degree electrically. 2. Left median neuropathy at or distal to the wrist, consistent with the clinical diagnosis of carpal tunnel syndrome; very mild in degree electrically.   ___________________________ Narda Amber, DO    Nerve Conduction Studies Anti Sensory Summary Table   Stim Site NR Peak (ms) Norm Peak (ms) P-T Amp (V) Norm P-T Amp  Left Median Anti Sensory (2nd Digit)  33.4C  Wrist    3.6 <3.6 29.6 >15  Right Median Anti Sensory (2nd Digit)  33.4C  Wrist    3.8 <3.6 18.6 >15  Left Ulnar Anti Sensory (5th Digit)  33.4C  Wrist    3.1 <3.1 12.1 >10  Right Ulnar Anti Sensory (5th Digit)  33.4C  Wrist     2.8 <3.1 21.2 >10   Motor Summary Table   Stim Site NR Onset (ms) Norm Onset (ms) O-P Amp (mV) Norm O-P Amp Site1 Site2 Delta-0 (ms) Dist (cm) Vel (m/s) Norm Vel (m/s)  Left Median Motor (Abd Poll Brev)  33.4C  Wrist    3.8 <4.0 8.2 >6 Elbow Wrist 4.6 27.0 59 >50  Elbow    8.4  8.1         Right Median Motor (Abd Poll Brev)  33.4C  Wrist    4.1 <4.0 6.6 >6 Elbow Wrist 4.6 26.0 57 >50  Elbow    8.7  6.5         Left Ulnar Motor (Abd Dig Minimi)  33.4C  Wrist    2.7 <3.1 7.2 >7 B Elbow Wrist 4.3 22.0 51 >50  B Elbow    7.0  7.1  A Elbow B Elbow 1.9 10.0 53 >50  A Elbow    8.9  7.1         Right Ulnar Motor (Abd Dig Minimi)  33.4C  Wrist    2.7 <3.1 8.3 >7 B Elbow Wrist 4.2 23.0 55 >50  B Elbow    6.9  7.4  A Elbow B Elbow 1.8 10.0 56 >50  A Elbow    8.7  7.0          Comparison Summary Table   Stim Site NR Peak (ms) Norm Peak (ms) P-T Amp (V) Site1 Site2 Delta-P (ms) Norm Delta (ms)  Left Median/Ulnar Palm Comparison (Wrist - 8cm)  33.4C  Median [employer]  2.5 <2.2 59.8 Median Palm Ulnar Palm 0.4   Ulnar Palm    2.1 <2.2 7.7       EMG   Side Muscle Ins Act Fibs Psw Fasc Number Recrt Dur Dur. Amp Amp. Poly Poly. Comment  Right 1stDorInt Nml Nml Nml Nml Nml Nml Nml Nml Nml Nml Nml Nml N/A  Right Abd Poll Brev Nml Nml Nml Nml 1- Mod-R Few 1+ Few 1+ Nml Nml N/A  Right Ext Indicis Nml Nml Nml Nml Nml Nml Nml Nml Nml Nml Nml Nml N/A  Right PronatorTeres Nml Nml Nml Nml Nml Nml Nml Nml Nml Nml Nml Nml N/A  Right Biceps Nml Nml Nml Nml Nml Nml Nml Nml Nml Nml Nml Nml N/A  Right Triceps Nml Nml Nml Nml Nml Nml Nml Nml Nml Nml Nml Nml N/A  Right Deltoid Nml Nml Nml Nml Nml Nml Nml Nml Nml Nml Nml Nml N/A  Left 1stDorInt Nml Nml Nml Nml Nml Nml Nml Nml Nml Nml Nml Nml N/A  Left Abd Poll Brev Nml Nml Nml Nml Nml Nml Nml Nml Nml Nml Nml Nml N/A  Left Ext Indicis Nml Nml Nml Nml Nml Nml Nml Nml Nml Nml Nml Nml N/A  Left PronatorTeres Nml Nml Nml Nml Nml Nml Nml Nml Nml Nml Nml Nml N/A    Left Biceps Nml Nml Nml Nml Nml Nml Nml Nml Nml Nml Nml Nml N/A  Left Triceps Nml Nml Nml Nml Nml Nml Nml Nml Nml Nml Nml Nml N/A  Left Deltoid Nml Nml Nml Nml Nml Nml Nml Nml Nml Nml Nml Nml N/A      Waveforms:

## 2016-10-05 NOTE — Telephone Encounter (Signed)
Results of EMG discussed with patient which shows bilateral carpal tunnel syndrome, worse on the right. Recommend that he start using bilateral wrist splints. Physical therapy was declined at this time, however if symptoms worsen, this will be reconsidered.  Donika K. Posey Pronto, DO

## 2016-10-26 ENCOUNTER — Ambulatory Visit: Payer: BLUE CROSS/BLUE SHIELD | Admitting: Audiology

## 2017-01-20 ENCOUNTER — Other Ambulatory Visit: Payer: Self-pay | Admitting: Adult Health

## 2017-01-20 DIAGNOSIS — I10 Essential (primary) hypertension: Secondary | ICD-10-CM

## 2017-02-27 ENCOUNTER — Ambulatory Visit: Payer: BLUE CROSS/BLUE SHIELD | Admitting: Adult Health

## 2017-03-08 ENCOUNTER — Encounter: Payer: Self-pay | Admitting: Adult Health

## 2017-03-08 ENCOUNTER — Ambulatory Visit (INDEPENDENT_AMBULATORY_CARE_PROVIDER_SITE_OTHER): Payer: BLUE CROSS/BLUE SHIELD | Admitting: Adult Health

## 2017-03-08 VITALS — BP 126/84 | Temp 97.4°F | Ht 73.5 in | Wt 238.2 lb

## 2017-03-08 DIAGNOSIS — M545 Low back pain, unspecified: Secondary | ICD-10-CM

## 2017-03-08 DIAGNOSIS — E669 Obesity, unspecified: Secondary | ICD-10-CM | POA: Diagnosis not present

## 2017-03-08 DIAGNOSIS — I10 Essential (primary) hypertension: Secondary | ICD-10-CM | POA: Diagnosis not present

## 2017-03-08 NOTE — Progress Notes (Signed)
Subjective:    Patient ID: Grant Bonilla, male    DOB: Nov 19, 1960, 56 y.o.   MRN: [id number]  HPI  56 year old male who  has a past medical history of Anxiety; Fuchs' corneal dystrophy; Hypertension; Insect bite; and Nail fungal infection. He presents to the office today for follow up regarding hypertension and obesity. I last saw him 6 months ago and he had started working on aerobic exercise. His blood pressure was 132/70 - he currently takes Ziac 10-6.25 mg.   He reports that his anxiety has been worse over the last few months and he has not been controlling his diet. He has been " trying" to exercise but has not been doing a good job doing it on a routine basis.   He also reports that he has had low back pain for the last few years but feels as though it is getting worse. Pain is exacerbated with movements such as twisting and bending. Pain is intermittent. He is unable to describe his pain.   BP Readings from Last 3 Encounters:  03/08/17 126/84  09/26/16 120/80  08/29/16 132/70    Wt Readings from Last 3 Encounters:  03/08/17 238 lb 3.2 oz (108 kg)  09/26/16 240 lb 3 oz (108.9 kg)  08/29/16 237 lb 14.4 oz (107.9 kg)    Review of Systems See HPI   Past Medical History:  Diagnosis Date  . Anxiety   . Fuchs' corneal dystrophy   . Hypertension   . Insect bite   . Nail fungal infection     Social History   Social History  . Marital status: Married    Spouse name: N/A  . Number of children: N/A  . Years of education: N/A   Occupational History  . Not on file.   Social History Main Topics  . Smoking status: Never Smoker  . Smokeless tobacco: Never Used  . Alcohol use No  . Drug use: No  . Sexual activity: Yes   Other Topics Concern  . Not on file   Social History Narrative   Patient is an Optometrist.    Married for 20 years    Three children (15,12,10) all live in the house   Pet Rabbit    Likes to read, watch movies, and [facility] to the [employer].     Past  Surgical History:  Procedure Laterality Date  . LITHOTRIPSY    . NASAL SEPTUM SURGERY     as a child  . TONSILLECTOMY      Family History  Problem Relation Age of Onset  . Hypertension Father   . Cancer Mother   . Colon cancer Neg Hx   . Esophageal cancer Neg Hx   . Rectal cancer Neg Hx   . Stomach cancer Neg Hx     No Known Allergies  Current Outpatient Prescriptions on File Prior to Visit  Medication Sig Dispense Refill  . atorvastatin (LIPITOR) 20 MG tablet Take 1 tablet (20 mg total) by mouth daily. 90 tablet 3  . bisoprolol-hydrochlorothiazide (ZIAC) 10-6.25 MG tablet TAKE 1 TABLET BY MOUTH DAILY 30 tablet 1  . buPROPion (WELLBUTRIN XL) 300 MG 24 hr tablet Take 1 tablet by mouth daily.    . citalopram (CELEXA) 20 MG tablet     . clonazePAM (KLONOPIN) 0.5 MG tablet Take 1 tablet by mouth daily.    . Lurasidone HCl 20 MG TABS Take 20 mg by mouth.    . Multiple Vitamin (MULTIVITAMIN) tablet Take  1 tablet by mouth daily.    . pseudoephedrine (SUDAFED) 30 MG tablet Take 30 mg by mouth daily as needed.      . valACYclovir (VALTREX) 500 MG tablet Take 1 tablet (500 mg total) by mouth 3 (three) times daily. 30 tablet 3   No current facility-administered medications on file prior to visit.     BP 126/84 (BP Location: Left Arm, Patient Position: Sitting, Cuff Size: Normal)   Temp 97.4 F (36.3 C) (Oral)   Ht 6' 1.5" (1.867 m)   Wt 238 lb 3.2 oz (108 kg)   BMI 31.00 kg/m       Objective:   Physical Exam  Constitutional: He is oriented to person, place, and time. He appears well-developed and well-nourished. No distress.  Cardiovascular: Normal rate, regular rhythm, normal heart sounds and intact distal pulses.  Exam reveals no gallop and no friction rub.   No murmur heard. Pulmonary/Chest: Effort normal and breath sounds normal. No respiratory distress. He has no wheezes. He has no rales. He exhibits no tenderness.  Musculoskeletal: Normal range of motion. He exhibits no  edema, tenderness or deformity.  No spinal tenderness. No decreased ROM    Neurological: He is alert and oriented to person, place, and time.  Skin: Skin is warm and dry. No rash noted. No erythema. No pallor.  Psychiatric: He has a normal mood and affect. His behavior is normal. Judgment and thought content normal.  Nursing note and vitals reviewed.     Assessment & Plan:  1. Acute bilateral low back pain without sciatica - Probable DDD.  - DG Lumbar Spine Complete; Future - Consider referral to ortho  2. Essential (primary) hypertension - Well controlled with current medication   3. Obesity (BMI 30.0-34.9) - Needs to control appetite and start exercising more - Encouraged him to go to spin class at the gym  - Talk to psychiatry about possibly increasing celexa for anxiety   Dorothyann Peng, NP

## 2017-03-12 ENCOUNTER — Other Ambulatory Visit: Payer: Self-pay | Admitting: Adult Health

## 2017-03-12 DIAGNOSIS — I10 Essential (primary) hypertension: Secondary | ICD-10-CM

## 2017-04-02 ENCOUNTER — Ambulatory Visit
Admission: RE | Admit: 2017-04-02 | Discharge: 2017-04-02 | Disposition: A | Discharge status: Home / Self Care | Payer: BLUE CROSS/BLUE SHIELD | Source: Ambulatory Visit | Attending: Adult Health | Admitting: Adult Health

## 2017-04-02 ENCOUNTER — Other Ambulatory Visit: Payer: Self-pay | Admitting: Adult Health

## 2017-04-02 DIAGNOSIS — M545 Low back pain, unspecified: Secondary | ICD-10-CM

## 2017-04-20 ENCOUNTER — Other Ambulatory Visit: Payer: Self-pay | Admitting: Adult Health

## 2017-04-20 DIAGNOSIS — I10 Essential (primary) hypertension: Secondary | ICD-10-CM

## 2018-02-27 ENCOUNTER — Encounter: Payer: Self-pay | Admitting: Internal Medicine

## 2018-09-14 IMAGING — US US THYROID
1 series · 12 of 25 positions shown · non-contrast
Comparison: None.

CLINICAL DATA: Palpable abnormality.

EXAM:
THYROID ULTRASOUND
TECHNIQUE: Ultrasound examination of the thyroid gland and adjacent soft
tissues was performed.

[Series 1: us thyroid · 0.08mm/px · 12 of 70 slices shown]
[im 3/70]
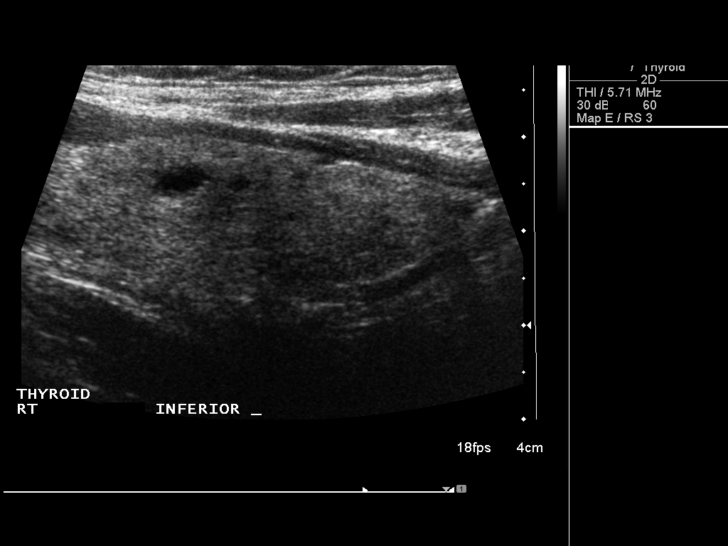
[im 9/70]
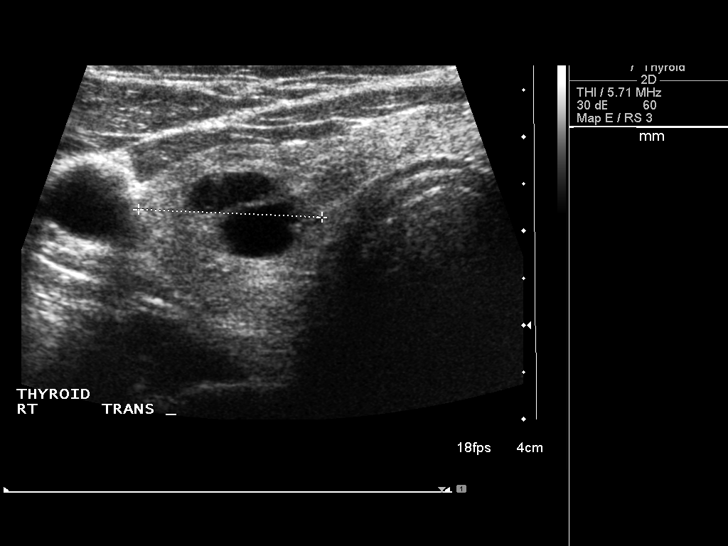
[im 15/70]
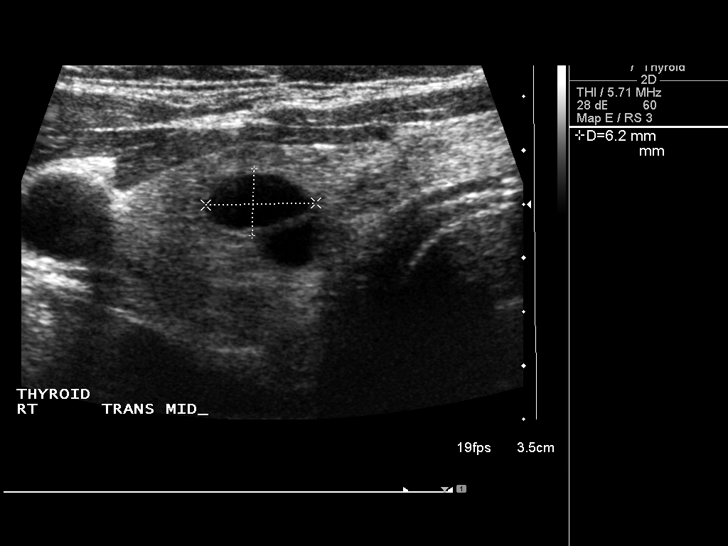
[im 21/70]
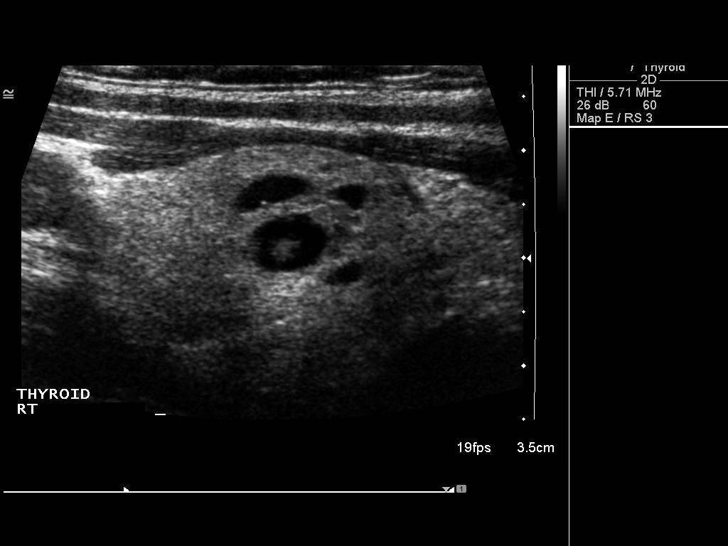
[im 26/70]
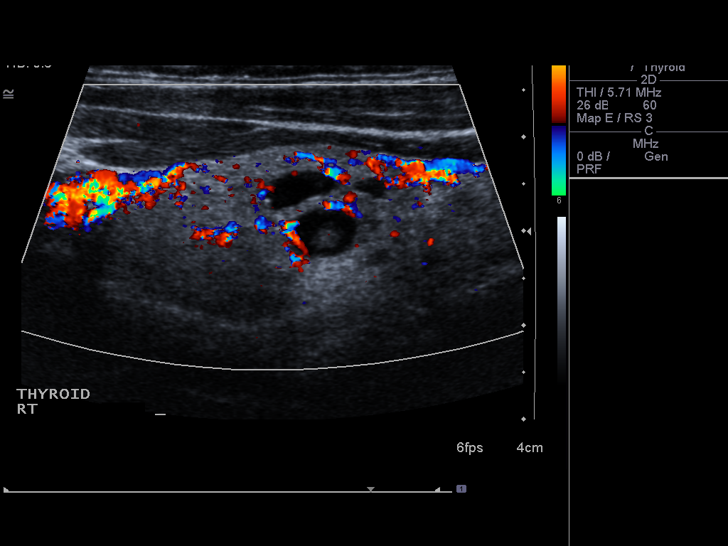
[im 32/70]
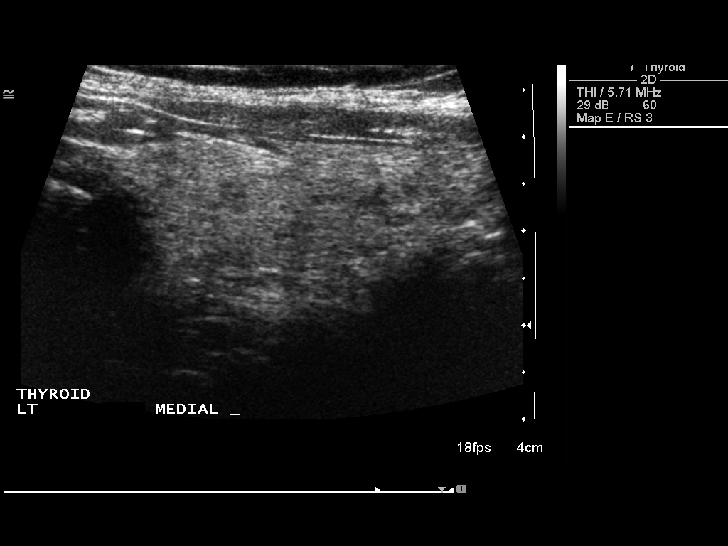
[im 38/70]
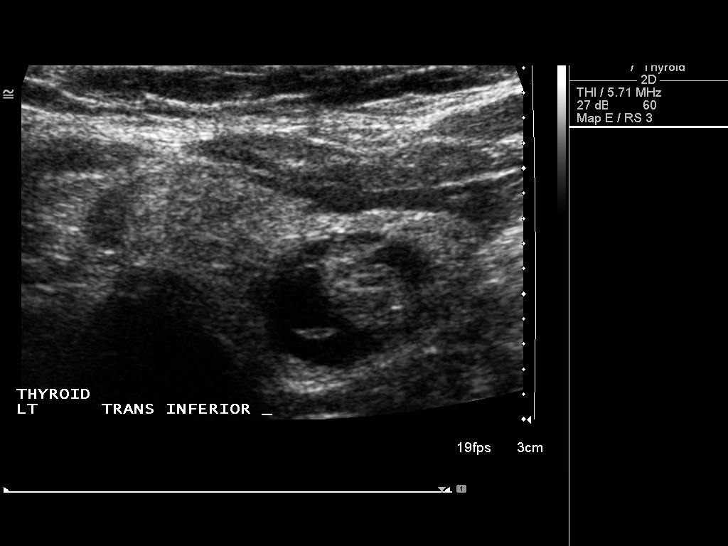
[im 44/70]
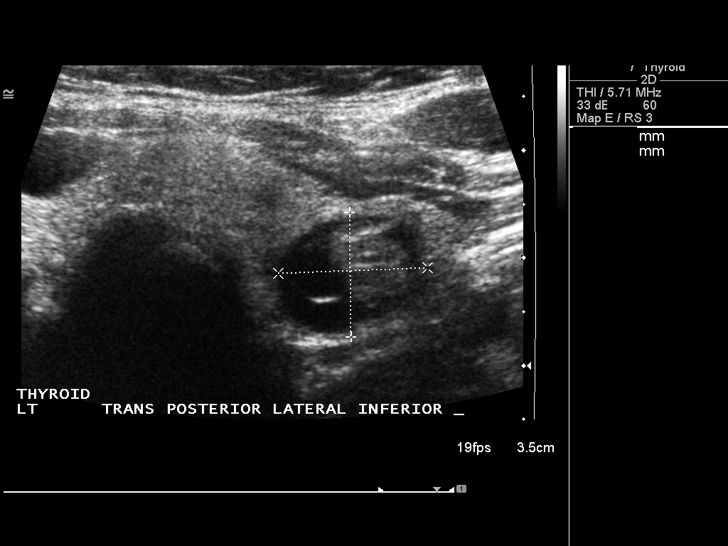
[im 49/70]
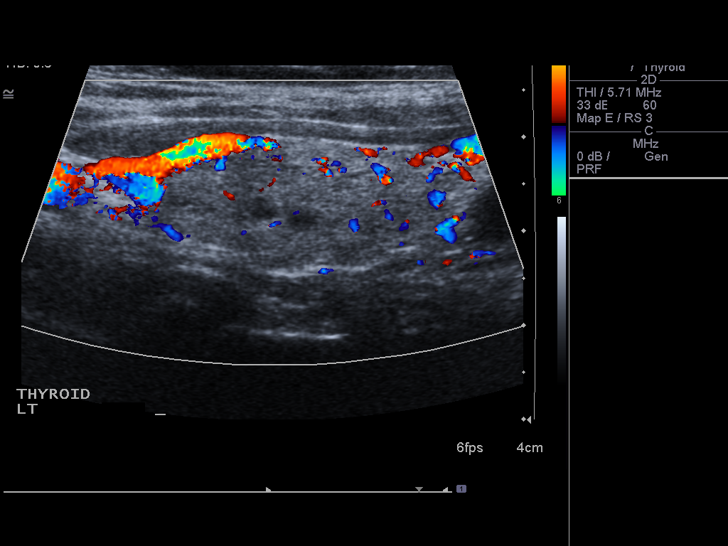
[im 55/70]
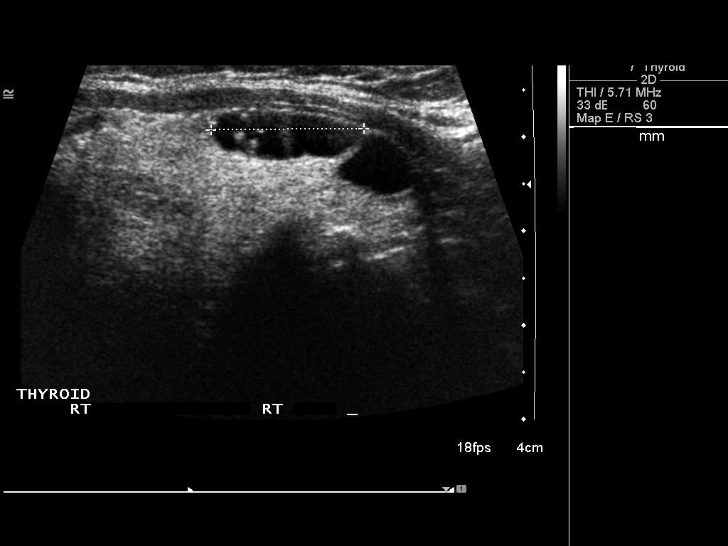
[im 61/70]
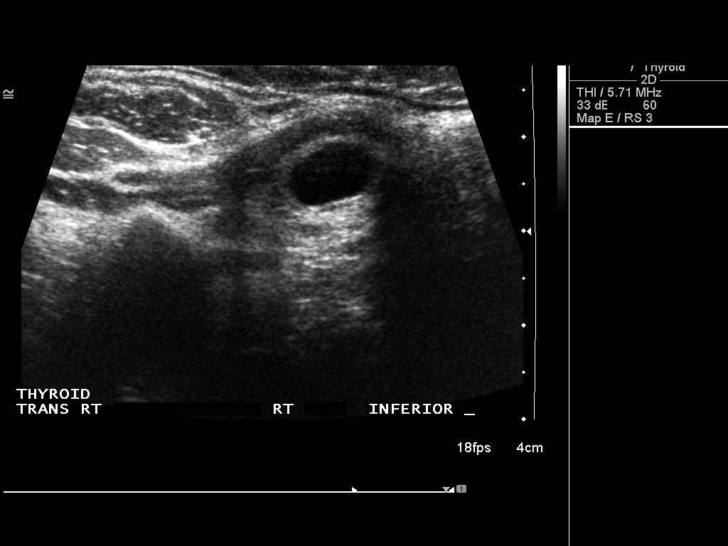
[im 67/70]
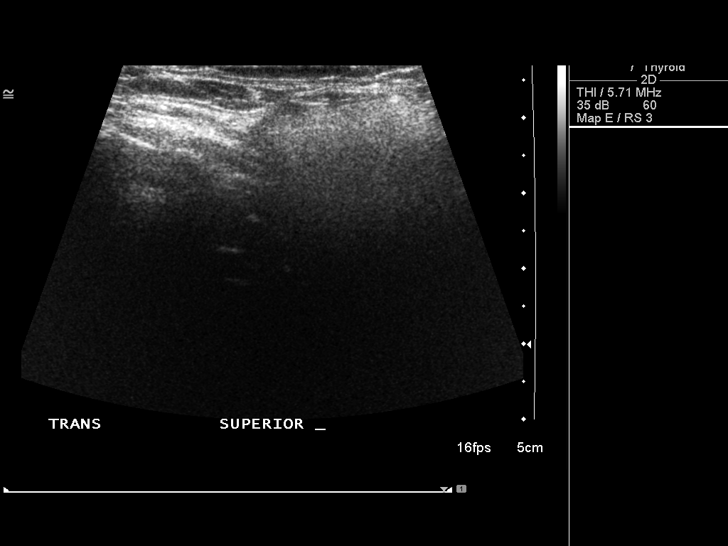

[12 of 25 positions shown; findings below may reference images not displayed]

FINDINGS: Parenchymal Echotexture: Mildly heterogenous

Estimated total number of nodules >/= 1 cm: 4

Number of spongiform nodules >/=  2 cm not described below (TR1): 0

Number of mixed cystic and solid nodules >/= 1.5 cm not described
below (TR2): 0

_________________________________________________________

Isthmus: 0.9 cm

Nodule # 1:

Location: Isthmus

Size: 1.6 x 0.5 x 1.3 cm

Composition: cystic/almost completely cystic (0)

Echogenicity: anechoic (0)

Shape: not taller-than-wide (0)

Margins: smooth (0)

Echogenic foci: large comet-tail artifacts (0)

ACR TI-RADS total points: 0.

ACR TI-RADS risk category: TR1 (0-1 points).

ACR TI-RADS recommendations:

Imaging findings are consistent with a colloid cyst. This nodule
does NOT meet TI-RADS criteria for biopsy or dedicated follow-up.

Nodule # 2:

Location: Isthmus

Size: 0.9 x 0.7 x 0.9 cm

Composition: cystic/almost completely cystic (0)

Echogenicity: anechoic (0)

Shape: not taller-than-wide (0)

Margins: smooth (0)

Echogenic foci: none (0)

ACR TI-RADS total points: 0.

ACR TI-RADS risk category: TR1 (0-1 points).

ACR TI-RADS recommendations:

Findings are consistent with a benign cyst. This nodule does NOT
meet TI-RADS criteria for biopsy or dedicated follow-up.

_________________________________________________________

Right lobe: 5.3 x 2.0 x 2.0 cm

Nodule # 1:

Location: Right; Mid

Size: 1.0 x 0.6 x 1.0 cm

Composition: cystic/almost completely cystic (0)

Echogenicity: anechoic (0)

Shape: not taller-than-wide (0)

Margins: smooth (0)

Echogenic foci: none (0)

ACR TI-RADS total points: 0.

ACR TI-RADS risk category: TR1 (0-1 points).

ACR TI-RADS recommendations:

Findings are consistent with a benign cyst. This nodule does NOT
meet TI-RADS criteria for biopsy or dedicated follow-up.

Nodule # 2:

Location: Right; Mid

Size: 1.1 x 0.7 x 1.0 cm

Composition: cystic/almost completely cystic (0)

Echogenicity: anechoic (0)

Shape: not taller-than-wide (0)

Margins: smooth (0)

Echogenic foci: none (0)

ACR TI-RADS total points: 0.

ACR TI-RADS risk category: TR1 (0-1 points).

ACR TI-RADS recommendations:

Findings are consistent with a colloid cyst. This nodule does NOT
meet TI-RADS criteria for biopsy or dedicated follow-up.

There are a few other scattered subcentimeter cysts in the right
lobe, all which have a benign appearance.

_________________________________________________________

Left lobe: 5.3 x 1.5 x 2.2 cm

Nodule # 1:

Location: Left; Inferior

Size: 1.2 x 1.2 x 1.4 cm

Composition: mixed cystic and solid (1)

Echogenicity: isoechoic (1)

Shape: not taller-than-wide (0)

Margins: smooth (0)

Echogenic foci: none (0)

ACR TI-RADS total points: 2.

ACR TI-RADS risk category: TR2 (2 points).

ACR TI-RADS recommendations:

This nodule does NOT meet TI-RADS criteria for biopsy or dedicated
follow-up.
IMPRESSION: Right-sided and isthmus cysts/colloid cysts. Left inferior mixed
cystic and solid nodule. Based on TI-RADS criteria, none of these
lesions meets criteria for biopsy or dedicated follow-up and are
benign or of very low suspicion.

The above is in keeping with the ACR TI-RADS recommendations - [HOSPITAL] 3290;[DATE].
